# Patient Record
Sex: Female | Born: 1980 | Hispanic: Yes | Marital: Married | Smoking: Never smoker
Health system: Southern US, Community
[De-identification: ages and names within clinical notes are randomized; demographics above are authoritative.]

## PROBLEM LIST (undated history)

## (undated) DIAGNOSIS — M542 Cervicalgia: Secondary | ICD-10-CM

## (undated) DIAGNOSIS — E7800 Pure hypercholesterolemia, unspecified: Secondary | ICD-10-CM

## (undated) DIAGNOSIS — G8929 Other chronic pain: Secondary | ICD-10-CM

## (undated) DIAGNOSIS — I82409 Acute embolism and thrombosis of unspecified deep veins of unspecified lower extremity: Secondary | ICD-10-CM

## (undated) DIAGNOSIS — I2699 Other pulmonary embolism without acute cor pulmonale: Secondary | ICD-10-CM

## (undated) HISTORY — DX: Other pulmonary embolism without acute cor pulmonale: I26.99

## (undated) HISTORY — DX: Acute embolism and thrombosis of unspecified deep veins of unspecified lower extremity: I82.409

---
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
## 2001-03-27 ENCOUNTER — Other Ambulatory Visit: Admission: RE | Admit: 2001-03-27 | Discharge: 2001-03-27 | Payer: Self-pay | Admitting: Internal Medicine

## 2001-11-28 ENCOUNTER — Encounter: Admission: RE | Admit: 2001-11-28 | Discharge: 2002-02-26 | Payer: Self-pay | Admitting: Family Medicine

## 2002-09-03 ENCOUNTER — Other Ambulatory Visit: Admission: RE | Admit: 2002-09-03 | Discharge: 2002-09-03 | Payer: Self-pay | Admitting: Family Medicine

## 2003-07-12 ENCOUNTER — Emergency Department (HOSPITAL_COMMUNITY): Admission: EM | Admit: 2003-07-12 | Discharge: 2003-07-12 | Payer: Self-pay | Admitting: Emergency Medicine

## 2006-06-17 ENCOUNTER — Emergency Department (HOSPITAL_COMMUNITY): Admission: EM | Admit: 2006-06-17 | Discharge: 2006-06-17 | Payer: Self-pay | Admitting: Emergency Medicine

## 2006-12-30 ENCOUNTER — Ambulatory Visit: Payer: Self-pay | Admitting: Internal Medicine

## 2007-01-03 LAB — CONVERTED CEMR LAB
ALT: 25 units/L (ref 0–35)
AST: 20 units/L (ref 0–37)
BUN: 10 mg/dL (ref 6–23)
CO2: 30 meq/L (ref 19–32)
Calcium: 9.5 mg/dL (ref 8.4–10.5)
Creatinine, Ser: 0.7 mg/dL (ref 0.4–1.2)
Direct LDL: 164.6 mg/dL
Eosinophils Absolute: 0.1 10*3/uL (ref 0.0–0.6)
Eosinophils Relative: 1.7 % (ref 0.0–5.0)
Folate: 6.6 ng/mL
GFR calc Af Amer: 129 mL/min
HCT: 36.4 % (ref 36.0–46.0)
HDL: 49.4 mg/dL (ref >39.0)
MCV: 89.1 fL (ref 78.0–100.0)
Neutrophils Relative %: 58.1 % (ref 43.0–77.0)
Platelets: 194 10*3/uL (ref 150–400)
Potassium: 4 meq/L (ref 3.5–5.1)
RBC: 4.09 M/uL (ref 3.87–5.11)
RDW: 12.6 % (ref 11.5–14.6)
Total CHOL/HDL Ratio: 4.6
Triglycerides: 58 mg/dL (ref 0–149)
VLDL: 12 mg/dL (ref 0–40)
WBC: 5 10*3/uL (ref 4.5–10.5)

## 2007-01-16 ENCOUNTER — Encounter: Payer: Self-pay | Admitting: Internal Medicine

## 2007-01-16 ENCOUNTER — Encounter: Admission: RE | Admit: 2007-01-16 | Discharge: 2007-01-16 | Payer: Self-pay | Admitting: Internal Medicine

## 2007-05-04 HISTORY — PX: TUBAL LIGATION: SHX77

## 2009-08-01 HISTORY — PX: HYSTEROSCOPY: SHX211

## 2012-06-17 ENCOUNTER — Encounter (HOSPITAL_COMMUNITY): Payer: Self-pay

## 2012-06-17 DIAGNOSIS — Z862 Personal history of diseases of the blood and blood-forming organs and certain disorders involving the immune mechanism: Secondary | ICD-10-CM | POA: Insufficient documentation

## 2012-06-17 DIAGNOSIS — R079 Chest pain, unspecified: Secondary | ICD-10-CM | POA: Insufficient documentation

## 2012-06-17 DIAGNOSIS — Y929 Unspecified place or not applicable: Secondary | ICD-10-CM | POA: Insufficient documentation

## 2012-06-17 DIAGNOSIS — M79609 Pain in unspecified limb: Secondary | ICD-10-CM | POA: Insufficient documentation

## 2012-06-17 DIAGNOSIS — Z8639 Personal history of other endocrine, nutritional and metabolic disease: Secondary | ICD-10-CM | POA: Insufficient documentation

## 2012-06-17 DIAGNOSIS — T148XXA Other injury of unspecified body region, initial encounter: Secondary | ICD-10-CM | POA: Insufficient documentation

## 2012-06-17 DIAGNOSIS — X58XXXA Exposure to other specified factors, initial encounter: Secondary | ICD-10-CM | POA: Insufficient documentation

## 2012-06-17 DIAGNOSIS — Y939 Activity, unspecified: Secondary | ICD-10-CM | POA: Insufficient documentation

## 2012-06-17 NOTE — ED Notes (Signed)
Pt reports fatigue and tingling in (L) arm starting Wednesday night, tingling in her arm subsided, pt reports tingling returned tonight while washing dishes and chest discomfort when moving from one side to the next. Pt denies SOB, N/V, or diaphoresis. Pt was dx w/fly 2 weeks ago, reports she still has a productive cough w/yellow/green mucus. No neuro deficits, grips and strengths equal bilateral, no slurred speech or arm drift

## 2012-06-18 ENCOUNTER — Emergency Department (HOSPITAL_COMMUNITY)
Admission: EM | Admit: 2012-06-18 | Discharge: 2012-06-18 | Disposition: A | Discharge status: Home / Self Care | Payer: 59 | Attending: Emergency Medicine | Admitting: Emergency Medicine

## 2012-06-18 ENCOUNTER — Encounter (HOSPITAL_COMMUNITY): Payer: Self-pay | Admitting: Emergency Medicine

## 2012-06-18 ENCOUNTER — Emergency Department (HOSPITAL_COMMUNITY): Payer: 59

## 2012-06-18 DIAGNOSIS — T148XXA Other injury of unspecified body region, initial encounter: Secondary | ICD-10-CM

## 2012-06-18 HISTORY — DX: Pure hypercholesterolemia, unspecified: E78.00

## 2012-06-18 LAB — COMPREHENSIVE METABOLIC PANEL
ALT: 12 U/L (ref 0–35)
AST: 17 U/L (ref 0–37)
Albumin: 3.6 g/dL (ref 3.5–5.2)
Calcium: 9.2 mg/dL (ref 8.4–10.5)
GFR calc Af Amer: 89 mL/min — ABNORMAL LOW (ref >90)
Potassium: 3.9 mEq/L (ref 3.5–5.1)
Sodium: 131 mEq/L — ABNORMAL LOW (ref 135–145)
Total Protein: 7 g/dL (ref 6.0–8.3)

## 2012-06-18 LAB — CBC WITH DIFFERENTIAL/PLATELET
Basophils Absolute: 0 10*3/uL (ref 0.0–0.1)
Basophils Relative: 0 % (ref 0–1)
Eosinophils Absolute: 0.1 10*3/uL (ref 0.0–0.7)
Eosinophils Relative: 1 % (ref 0–5)
MCH: 30.9 pg (ref 26.0–34.0)
MCV: 89 fL (ref 78.0–100.0)
Neutrophils Relative %: 54 % (ref 43–77)
Platelets: 186 10*3/uL (ref 150–400)
RDW: 13.1 % (ref 11.5–15.5)

## 2012-06-18 LAB — POCT I-STAT TROPONIN I: Troponin i, poc: 0.01 ng/mL (ref 0.00–0.08)

## 2012-06-18 MED ORDER — NAPROXEN 375 MG PO TABS: 375.0000 mg | ORAL_TABLET | Freq: Two times a day (BID) | ORAL | Status: DC

## 2012-06-18 MED ORDER — METHOCARBAMOL 500 MG PO TABS
1000.0000 mg | ORAL_TABLET | Freq: Once | ORAL | Status: AC
  Administered 2012-06-18: 1000 mg via ORAL
  Filled 2012-06-18: qty 2

## 2012-06-18 MED ORDER — METHOCARBAMOL 500 MG PO TABS: 500.0000 mg | ORAL_TABLET | Freq: Two times a day (BID) | ORAL | Status: DC

## 2012-06-18 NOTE — ED Provider Notes (Signed)
History     CSN: [id number]  Arrival date & time 06/17/12  2335   First MD Initiated Contact with Patient 06/18/12 0034      Chief Complaint  Patient presents with  . Weakness  . Arm Pain  . Chest Pain    (Consider location/radiation/quality/duration/timing/severity/associated sxs/prior treatment) Patient is a 32 y.o. female presenting with arm pain. The history is provided by the patient.  Arm Pain This is a new problem. The current episode started more than 2 days ago. The problem occurs constantly. The problem has not changed since onset.Associated symptoms include chest pain. Pertinent negatives include no shortness of breath. Associated symptoms comments: Upper left back pain. Nothing aggravates the symptoms. Nothing relieves the symptoms. She has tried nothing for the symptoms. The treatment provided no relief.  Pain is in the upper left back.  No pain with inspiration.  No SOB.  No leg swelling or pain.  No long car trips or plane trips  Past Medical History  Diagnosis Date  . High cholesterol     Past Surgical History  Procedure Laterality Date  . Cesarean section    . Tubal ligation      Family History  Problem Relation Age of Onset  . Heart failure Mother   . Heart failure Father     History  Substance Use Topics  . Smoking status: Never Smoker   . Smokeless tobacco: Not on file  . Alcohol Use: No    OB History   Grav Para Term Preterm Abortions TAB SAB Ect Mult Living                  Review of Systems  Respiratory: Negative for shortness of breath.   Cardiovascular: Positive for chest pain. Negative for palpitations and leg swelling.  All other systems reviewed and are negative.    Allergies  Sulfa antibiotics  Home Medications  No current outpatient prescriptions on file.  BP 121/62  Pulse 62  Temp(Src) 98.7 F (37.1 C) (Oral)  Resp 16  SpO2 100%  LMP 05/22/2012  Physical Exam  Constitutional: She is oriented to person, place, and  time. She appears well-developed and well-nourished.  HENT:  Head: Normocephalic and atraumatic.  Mouth/Throat: Oropharynx is clear and moist.  Eyes: Conjunctivae are normal. Pupils are equal, round, and reactive to light.  Neck: Normal range of motion. Neck supple.  No step off crepitance of the c spine  Cardiovascular: Normal rate, regular rhythm and intact distal pulses.   Pulmonary/Chest: Effort normal and breath sounds normal. She has no wheezes. She has no rales.  Abdominal: Soft. Bowel sounds are normal. There is no tenderness. There is no rebound and no guarding.  Neurological: She is alert and oriented to person, place, and time. She has normal reflexes. She displays normal reflexes. Coordination normal.  Sensation intact to LUE  Skin: Skin is warm and dry.  Psychiatric: She has a normal mood and affect.    ED Course  Procedures (including critical care time)  Labs Reviewed  COMPREHENSIVE METABOLIC PANEL - Abnormal; Notable for the following:    Sodium 131 (*)    GFR calc non Af Amer 77 (*)    GFR calc Af Amer 89 (*)    All other components within normal limits  CBC WITH DIFFERENTIAL  POCT I-STAT TROPONIN I   No results found.   No diagnosis found.    MDM   Date: 06/18/2012  Rate: 67  Rhythm: normal sinus rhythm  QRS Axis: normal  Intervals: normal  ST/T Wave abnormalities: normal  Conduction Disutrbances: none  Narrative Interpretation: unremarkable   PERC negative and wells 0  Patient recently started new exercise routine with weights with palpable muscle spasm in upper back and in the setting of > 8 hours of sx ACS is excluded.  Return for worsening symptoms follow up with your family doctor for ongoing care        Cassanda Walmer K Willean Schurman-Rasch, MD 06/18/12 [phone]

## 2012-06-18 NOTE — ED Notes (Signed)
The patient is AOx4 and comfortable with her discharge instructions. 

## 2012-10-27 ENCOUNTER — Encounter: Payer: Self-pay | Admitting: *Deleted

## 2012-11-09 ENCOUNTER — Ambulatory Visit (INDEPENDENT_AMBULATORY_CARE_PROVIDER_SITE_OTHER): Payer: 59 | Admitting: Nurse Practitioner

## 2012-11-09 ENCOUNTER — Encounter: Payer: Self-pay | Admitting: Nurse Practitioner

## 2012-11-09 VITALS — BP 114/50 | HR 64 | Resp 12 | Ht 66.0 in | Wt 183.4 lb

## 2012-11-09 DIAGNOSIS — Z01419 Encounter for gynecological examination (general) (routine) without abnormal findings: Secondary | ICD-10-CM

## 2012-11-09 DIAGNOSIS — Z0000 Encounter for general adult medical examination without abnormal findings: Secondary | ICD-10-CM

## 2012-11-09 LAB — POCT URINALYSIS DIPSTICK
Leukocytes, UA: NEGATIVE
Urobilinogen, UA: NEGATIVE
pH, UA: 5.5

## 2012-11-09 NOTE — Progress Notes (Signed)
32 y.o. [id number] Married African American Fe here for annual exam.  Menses is regular lasting 6-7. some cramps CD 15 -18 with PMS and emotional symptoms with no energy. So decrease in physical exercise during that time.  Patient's last menstrual period was 11/03/2012.          Sexually active: yes  The current method of family planning is tubal ligation.    Exercising: yes  strength training and circuit  Smoker:  no  Health Maintenance: Pap:  11/08/2011 normal with  Negative HR HPV MMG:  never TDaP:  7/3//2012 Labs: Hgb-13.8    reports that she has never smoked. She has never used smokeless tobacco. She reports that she does not drink alcohol or use illicit drugs.  Past Medical History  Diagnosis Date  . High cholesterol     Past Surgical History  Procedure Laterality Date  . Cesarean section    . Tubal ligation    . Vaginal delivery      x3  . Induced abortion    . Polypectomy    . Hysteroscopy      Current Outpatient Prescriptions  Medication Sig Dispense Refill  . b complex vitamins tablet Take 1 tablet by mouth daily.      . magnesium 30 MG tablet Take 30 mg by mouth 2 (two) times daily.       No current facility-administered medications for this visit.    Family History  Problem Relation Age of Onset  . Heart failure Mother   . Heart failure Father   . Diabetes Brother   . Breast cancer Paternal Aunt   . Cancer Paternal Aunt     colon cancer    ROS:  Pertinent items are noted in HPI.  Otherwise, a comprehensive ROS was negative.  Exam:   BP 114/50  Pulse 64  Resp 12  Ht 5\' 6"  (1.676 m)  Wt 183 lb 6.4 oz (83.19 kg)  BMI 29.62 kg/m2  LMP 11/03/2012 Height: 5\' 6"  (167.6 cm) weight 203 at visit 11/2011 Ht Readings from Last 3 Encounters:  11/09/12 5\' 6"  (1.676 m)  12/30/06 5\' 6"  (1.676 m)    General appearance: alert, cooperative and appears stated age Head: Normocephalic, without obvious abnormality, atraumatic Neck: no adenopathy, supple, symmetrical,  trachea midline and thyroid normal to inspection and palpation Lungs: clear to auscultation bilaterally Breasts: normal appearance, no masses or tenderness Heart: regular rate and rhythm Abdomen: soft, non-tender; no masses,  no organomegaly Extremities: extremities normal, atraumatic, no cyanosis or edema Skin: Skin color, texture, turgor normal. No rashes or lesions Lymph nodes: Cervical, supraclavicular, and axillary nodes normal. No abnormal inguinal nodes palpated Neurologic: Grossly normal   Pelvic: External genitalia:  no lesions              Urethra:  normal appearing urethra with no masses, tenderness or lesions              Bartholin's and Skene's: normal                 Vagina: normal appearing vagina with normal color and discharge, no lesions              Cervix: anteverted              Pap taken: no Bimanual Exam:  Uterus:  normal size, contour, position, consistency, mobility, non-tender              Adnexa: no mass, fullness, tenderness  Rectovaginal: Confirms               Anus:  normal sphincter tone, no lesions  A:  Well Woman with normal exam  S/P BTL 2009  History of PMS  P:   Pap smear as per guidelines   Mammogram  Due at age 84  counseled on diet and exercise  Discussion of treatments for PMS - declines SSRI, she may try OTC Vit B return annually or prn  An After Visit Summary was printed and given to the patient.

## 2012-11-09 NOTE — Patient Instructions (Addendum)

## 2012-11-11 NOTE — Progress Notes (Signed)
Encounter reviewed by Dr. Jaquell Seddon Silva.  

## 2013-04-18 ENCOUNTER — Telehealth: Payer: Self-pay | Admitting: Nurse Practitioner

## 2013-04-18 NOTE — Telephone Encounter (Signed)
Patient thinks she may have an ingrown hair wants to see patty.

## 2013-04-18 NOTE — Telephone Encounter (Signed)
Patient describes ingrown hair marble sized on Vulvar area. Denies fever. Requests appointment. OV scheduled with Lauro Franklin, FNP for 12/18 at 0945.  Routing to provider for final review. Patient agreeable to disposition. Will close encounter

## 2013-04-19 ENCOUNTER — Encounter: Payer: Self-pay | Admitting: Nurse Practitioner

## 2013-04-19 ENCOUNTER — Ambulatory Visit (INDEPENDENT_AMBULATORY_CARE_PROVIDER_SITE_OTHER): Payer: 59 | Admitting: Nurse Practitioner

## 2013-04-19 VITALS — BP 116/64 | HR 68 | Ht 66.0 in | Wt 191.0 lb

## 2013-04-19 DIAGNOSIS — L723 Sebaceous cyst: Secondary | ICD-10-CM

## 2013-04-19 MED ORDER — CEPHALEXIN 500 MG PO CAPS: ORAL_CAPSULE | ORAL | Status: DC

## 2013-04-19 NOTE — Patient Instructions (Addendum)
Epidermal Cyst An epidermal cyst is sometimes called a sebaceous cyst, epidermal inclusion cyst, or infundibular cyst. These cysts usually contain a substance that looks "pasty" or "cheesy" and may have a bad smell. This substance is a protein called keratin. Epidermal cysts are usually found on the face, neck, or trunk. They may also occur in the vaginal area or other parts of the genitalia of both men and women. Epidermal cysts are usually small, painless, slow-growing bumps or lumps that move freely under the skin. It is important not to try to pop them. This may cause an infection and lead to tenderness and swelling. CAUSES  Epidermal cysts may be caused by a deep penetrating injury to the skin or a plugged hair follicle, often associated with acne. SYMPTOMS  Epidermal cysts can become inflamed and cause:  Redness.  Tenderness.  Increased temperature of the skin over the bumps or lumps.  Grayish-white, bad smelling material that drains from the bump or lump. DIAGNOSIS  Epidermal cysts are easily diagnosed by your caregiver during an exam. Rarely, a tissue sample (biopsy) may be taken to rule out other conditions that may resemble epidermal cysts. TREATMENT   Epidermal cysts often get better and disappear on their own. They are rarely ever cancerous.  If a cyst becomes infected, it may become inflamed and tender. This may require opening and draining the cyst. Treatment with antibiotics may be necessary. When the infection is gone, the cyst may be removed with minor surgery.  Small, inflamed cysts can often be treated with antibiotics or by injecting steroid medicines.  Sometimes, epidermal cysts become large and bothersome. If this happens, surgical removal in your caregiver's office may be necessary. HOME CARE INSTRUCTIONS  Only take over-the-counter or prescription medicines as directed by your caregiver.  Take your antibiotics as directed. Finish them even if you start to feel  better. SEEK MEDICAL CARE IF:   Your cyst becomes tender, red, or swollen.  Your condition is not improving or is getting worse.  You have any other questions or concerns. MAKE SURE YOU:  Understand these instructions.  Will watch your condition.  Will get help right away if you are not doing well or get worse. Document Released: 03/20/2004 Document Revised: 07/12/2011 Document Reviewed: 10/26/2010 [facility] Patient Information 2014 [place], [place].   If symptoms worsens to call back

## 2013-04-19 NOTE — Progress Notes (Signed)
Subjective:     Patient ID: Olivia Harrison, female   DOB: April 13, 1981, 32 y.o.   MRN: [id number]  HPI This 32 yo MAA Fe G5,P4 Presents with complaints of right vulva irritation.  These symptoms started with her menses and having to ear overnight pads due to heavy cycle.  Then toward the end of cycle felt that her hair got stuck on the adhesive and pulled the hair.  It was more painful yesterday with a small amount of exudate that was blood tinged. Several months ago had a similar episode of sebaceous cyst at the suprapubic line that occurred after shaving.  No changes in partner.  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  No known exposure to MRSA.  Review of Systems  Constitutional: Negative for fever, chills and fatigue.  Respiratory: Negative.   Cardiovascular: Negative.   Gastrointestinal: Negative.  Negative for nausea, vomiting, abdominal pain, diarrhea and abdominal distention.  Genitourinary: Positive for genital sores. Negative for dysuria, urgency, frequency, flank pain, vaginal bleeding, vaginal discharge, vaginal pain, menstrual problem, pelvic pain and dyspareunia.  Skin: Negative.   Neurological: Negative.   Psychiatric/Behavioral: Negative.       Objective:   Physical Exam  Constitutional: She is oriented to person, place, and time. She appears well-developed and well-nourished. No distress.  Abdominal: Soft. She exhibits no distension and no mass. There is no tenderness. There is no rebound and no guarding.  Genitourinary:     Right labia cyst with maybe 1/2 cm induration.  No exudate expressed. It is soft. There is a hair very close to the lesion opening. No lymph node swelling or pain.  Herpes culture was taken given her history but doubtful that this is the cause.  Neurological: She is alert and oriented to person, place, and time.  Skin: Skin is warm and dry.  Psychiatric: She has a normal mood and  affect. Her behavior is normal. Judgment and thought content normal.       Assessment:     Sebaceous cyst right labia R/O HSV    Plan:     Start on Keflex 500 mg BID for a week. She will continue with warm compresses and soaks.  If lesion gets worse or needs I & D to CB.  It should go away with med's.  Will call her with culture results

## 2013-04-19 NOTE — Progress Notes (Signed)
Reviewed personally.  M. Suzanne Bristol Soy, MD.  

## 2013-04-30 ENCOUNTER — Encounter: Payer: Self-pay | Admitting: Nurse Practitioner

## 2013-11-15 ENCOUNTER — Ambulatory Visit: Payer: 59 | Admitting: Nurse Practitioner

## 2013-11-26 ENCOUNTER — Encounter: Payer: Self-pay | Admitting: Nurse Practitioner

## 2013-11-26 ENCOUNTER — Ambulatory Visit (INDEPENDENT_AMBULATORY_CARE_PROVIDER_SITE_OTHER): Payer: 59 | Admitting: Nurse Practitioner

## 2013-11-26 VITALS — BP 104/62 | HR 76 | Ht 66.0 in | Wt 198.0 lb

## 2013-11-26 DIAGNOSIS — Z01419 Encounter for gynecological examination (general) (routine) without abnormal findings: Secondary | ICD-10-CM

## 2013-11-26 NOTE — Progress Notes (Signed)
Patient ID: Olivia Harrison, female   DOB: 11/25/1980, 33 y.o.   MRN: [id number] 33 y.o. [id number] Married African American Fe here for annual exam.  Menses are normal, flow for 5-6 days. Heavy for 1-2 then moderate to light. Some cramps and relief with OTC NSAID'S.  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  Patient's last menstrual period was 10/29/2013.          Sexually active: yes  The current method of family planning is tubal ligation.  Exercising: yes strength training and circuit  Smoker: no   Health Maintenance:  Pap: 11/08/2011 normal with Negative HR HPV  MMG: never  TDaP: 7/3//2012  Labs:  Dr. Ricki Miller    reports that she has never smoked. She has never used smokeless tobacco. She reports that she does not drink alcohol or use illicit drugs.  Past Medical History  Diagnosis Date  . High cholesterol     Past Surgical History  Procedure Laterality Date  . Cesarean section  2009    x1  . Vaginal delivery      x3  . Induced abortion    . Hysteroscopy  08/2009    polyps  . Tubal ligation  2009    Current Outpatient Prescriptions  Medication Sig Dispense Refill  . b complex vitamins tablet Take 1 tablet by mouth daily.      . magnesium 30 MG tablet Take 30 mg by mouth 2 (two) times daily.       No current facility-administered medications for this visit.    Family History  Problem Relation Age of Onset  . Heart failure Mother   . Heart failure Father   . Diabetes Brother   . Cancer Paternal Aunt     colon cancer  . Hyperlipidemia Sister   . Heart failure Sister   . Hyperlipidemia Brother   . Breast cancer Maternal Aunt   . Breast cancer Paternal Aunt     ROS:  Pertinent items are noted in HPI.  Otherwise, a comprehensive ROS was negative.  Exam:   BP 104/62  Pulse 76  Ht 5\' 6"  (1.676 m)  Wt 198 lb (89.812 kg)  BMI 31.97 kg/m2  LMP 10/29/2013 Height: 5\' 6"  (167.6 cm)  Ht Readings from Last 3 Encounters:  11/26/13 5\' 6"  (1.676 m)   04/19/13 5\' 6"  (1.676 m)  11/09/12 5\' 6"  (1.676 m)    General appearance: alert, cooperative and appears stated age Head: Normocephalic, without obvious abnormality, atraumatic Neck: no adenopathy, supple, symmetrical, trachea midline and thyroid normal to inspection and palpation Lungs: clear to auscultation bilaterally Breasts: normal appearance, no masses or tenderness Heart: regular rate and rhythm Abdomen: soft, non-tender; no masses,  no organomegaly Extremities: extremities normal, atraumatic, no cyanosis or edema Skin: Skin color, texture, turgor normal. No rashes or lesions Lymph nodes: Cervical, supraclavicular, and axillary nodes normal. No abnormal inguinal nodes palpated Neurologic: Grossly normal   Pelvic: External genitalia:  no lesions              Urethra:  normal appearing urethra with no masses, tenderness or lesions              Bartholin's and Skene's: normal                 Vagina: normal appearing vagina with normal color and discharge, no lesions              Cervix: anteverted  Pap taken: No. Bimanual Exam:  Uterus:  normal size, contour, position, consistency, mobility, non-tender              Adnexa: no mass, fullness, tenderness               Rectovaginal: Confirms               Anus:  normal sphincter tone, no lesions  A:  Well Woman with normal exam  S/P BTL 2009  S/P H' Scopic D&C with polypectomy 08/2009  P:   Reviewed health and wellness pertinent to exam  Pap smear not taken today  Mammogram is due age 33 but she prefers to get one earlier [facility]  Counseled on breast self exam, adequate intake of calcium and vitamin D, diet and exercise return annually or prn  An After Visit Summary was printed and given to the patient.

## 2013-11-26 NOTE — Patient Instructions (Signed)

## 2013-11-29 NOTE — Progress Notes (Signed)
Encounter reviewed by Dr. Brook Silva.  

## 2014-03-04 ENCOUNTER — Encounter: Payer: Self-pay | Admitting: Nurse Practitioner

## 2014-08-15 ENCOUNTER — Telehealth: Payer: Self-pay | Admitting: Nurse Practitioner

## 2014-08-15 ENCOUNTER — Encounter: Payer: Self-pay | Admitting: Nurse Practitioner

## 2014-08-15 NOTE — Telephone Encounter (Signed)
I was unable to reach patient by phone, mailed letter. Re: upcoming appointment has been canceled and needs to be rescheduled.

## 2014-11-28 ENCOUNTER — Ambulatory Visit: Payer: Self-pay | Admitting: Nurse Practitioner

## 2014-11-29 ENCOUNTER — Ambulatory Visit: Payer: 59 | Admitting: Nurse Practitioner

## 2015-03-06 ENCOUNTER — Telehealth: Payer: Self-pay | Admitting: Nurse Practitioner

## 2015-03-06 DIAGNOSIS — Z0000 Encounter for general adult medical examination without abnormal findings: Secondary | ICD-10-CM

## 2015-03-06 NOTE — Telephone Encounter (Signed)
Spoke with patient. Patient is scheduled for her aex on 03/26/2015. Would like to come in early to have fasting labs performed to discuss with Ria CommentPatricia Grubb, FNP at her aex. Lab appointment scheduled for 11/16 at 8:30 am. Agreeable to date and time. Would like to have lipid panel, Vitamin D, CMP, CBC, HgA1C, and TSH checked. Typically has these performed at Dr.Pang's office, but has not yet found another provider after he moved. Advised we are able to perform these labs with our office. Patient is agreeable. Orders placed.  Routing to provider for final review. Patient agreeable to disposition. Will close encounter.

## 2015-03-06 NOTE — Telephone Encounter (Signed)
Patient  has some questions for the nurse. She wants to come in early to have her blood work done. She is scheduled for 03/26/15 for aex. Will she need a order?

## 2015-03-19 ENCOUNTER — Other Ambulatory Visit (INDEPENDENT_AMBULATORY_CARE_PROVIDER_SITE_OTHER): Payer: 59

## 2015-03-19 DIAGNOSIS — Z0000 Encounter for general adult medical examination without abnormal findings: Secondary | ICD-10-CM

## 2015-03-19 LAB — CBC WITH DIFFERENTIAL/PLATELET
Basophils Absolute: 0 10*3/uL (ref 0.0–0.1)
Basophils Relative: 0 % (ref 0–1)
Eosinophils Absolute: 0.1 10*3/uL (ref 0.0–0.7)
Eosinophils Relative: 3 % (ref 0–5)
HEMATOCRIT: 40.5 % (ref 36.0–46.0)
HEMOGLOBIN: 13.4 g/dL (ref 12.0–15.0)
LYMPHS ABS: 1.4 10*3/uL (ref 0.7–4.0)
LYMPHS PCT: 32 % (ref 12–46)
MCH: 30.5 pg (ref 26.0–34.0)
MCHC: 33.1 g/dL (ref 30.0–36.0)
MCV: 92.3 fL (ref 78.0–100.0)
MONO ABS: 0.4 10*3/uL (ref 0.1–1.0)
MONOS PCT: 10 % (ref 3–12)
MPV: 11.7 fL (ref 8.6–12.4)
Neutro Abs: 2.4 10*3/uL (ref 1.7–7.7)
Neutrophils Relative %: 55 % (ref 43–77)
Platelets: 175 10*3/uL (ref 150–400)
RBC: 4.39 MIL/uL (ref 3.87–5.11)
RDW: 13.7 % (ref 11.5–15.5)
WBC: 4.4 10*3/uL (ref 4.0–10.5)

## 2015-03-19 LAB — HEMOGLOBIN A1C
Hgb A1c MFr Bld: 5.2 % (ref <5.7)
MEAN PLASMA GLUCOSE: 103 mg/dL (ref <117)

## 2015-03-19 LAB — COMPREHENSIVE METABOLIC PANEL
ALBUMIN: 4.1 g/dL (ref 3.6–5.1)
ALK PHOS: 63 U/L (ref 33–115)
ALT: 15 U/L (ref 6–29)
AST: 19 U/L (ref 10–30)
BUN: 13 mg/dL (ref 7–25)
CHLORIDE: 104 mmol/L (ref 98–110)
CO2: 28 mmol/L (ref 20–31)
CREATININE: 0.81 mg/dL (ref 0.50–1.10)
Calcium: 9 mg/dL (ref 8.6–10.2)
Glucose, Bld: 77 mg/dL (ref 65–99)
Potassium: 4.3 mmol/L (ref 3.5–5.3)
SODIUM: 139 mmol/L (ref 135–146)
TOTAL PROTEIN: 6.8 g/dL (ref 6.1–8.1)
Total Bilirubin: 0.7 mg/dL (ref 0.2–1.2)

## 2015-03-19 LAB — TSH: TSH: 1.684 u[IU]/mL (ref 0.350–4.500)

## 2015-03-19 LAB — LIPID PANEL
CHOL/HDL RATIO: 2.9 ratio (ref <=5.0)
CHOLESTEROL: 219 mg/dL — AB (ref 125–200)
HDL: 76 mg/dL (ref >=46)
LDL Cholesterol: 135 mg/dL — ABNORMAL HIGH (ref <130)
Triglycerides: 40 mg/dL (ref <150)
VLDL: 8 mg/dL (ref <30)

## 2015-03-20 LAB — VITAMIN D 25 HYDROXY (VIT D DEFICIENCY, FRACTURES): VIT D 25 HYDROXY: 27 ng/mL — AB (ref 30–100)

## 2015-03-26 ENCOUNTER — Ambulatory Visit: Payer: Self-pay | Admitting: Nurse Practitioner

## 2015-03-26 ENCOUNTER — Encounter: Payer: Self-pay | Admitting: Nurse Practitioner

## 2015-04-15 ENCOUNTER — Ambulatory Visit (INDEPENDENT_AMBULATORY_CARE_PROVIDER_SITE_OTHER): Payer: 59 | Admitting: Nurse Practitioner

## 2015-04-15 ENCOUNTER — Encounter: Payer: Self-pay | Admitting: Nurse Practitioner

## 2015-04-15 VITALS — BP 100/64 | HR 64 | Ht 66.5 in | Wt 168.0 lb

## 2015-04-15 DIAGNOSIS — Z0000 Encounter for general adult medical examination without abnormal findings: Secondary | ICD-10-CM

## 2015-04-15 DIAGNOSIS — Z01419 Encounter for gynecological examination (general) (routine) without abnormal findings: Secondary | ICD-10-CM

## 2015-04-15 DIAGNOSIS — R87619 Unspecified abnormal cytological findings in specimens from cervix uteri: Secondary | ICD-10-CM

## 2015-04-15 MED ORDER — VITAMIN D (ERGOCALCIFEROL) 1.25 MG (50000 UNIT) PO CAPS: 50000.0000 [IU] | ORAL_CAPSULE | ORAL | Status: DC

## 2015-04-15 NOTE — Patient Instructions (Signed)

## 2015-04-15 NOTE — Progress Notes (Signed)
Patient ID: Early CharsRosalind L Harrison, female   DOB: 01-20-1981, 34 y.o.   MRN: [id number]  34 y.o. [id number] Married  African American Fe here for annual exam.  Menses for 7 days. 3 days heavier with use of super pad changing  2-3 hours.  Flow is manageable.  Some cramps.  Patient's last menstrual period was 04/11/2015 (exact date).          Sexually active: Yes.    The current method of family planning is tubal ligation.    Exercising: Yes.    Gym/ health club routine includes high impact aerobics  and and strength training 6 days per week. Smoker:  no  Health Maintenance: Pap: 11/08/2011 normal with Negative HR HPV Mammo:  05/2014 - requested copy TDaP: 7/3//2012  Labs:03/19/15 in EPIC  Urine: large RBC - on menses, PH: 5   reports that she has never smoked. She has never used smokeless tobacco. She reports that she does not drink alcohol or use illicit drugs.  Past Medical History  Diagnosis Date  . High cholesterol     Past Surgical History  Procedure Laterality Date  . Cesarean section  2009    x1  . Vaginal delivery      x3  . Induced abortion    . Hysteroscopy  08/2009    polyps  . Tubal ligation  2009    Current Outpatient Prescriptions  Medication Sig Dispense Refill  . b complex vitamins tablet Take 1 tablet by mouth daily.    . Cholecalciferol (VITAMIN D) 2000 UNITS CAPS Take 1 capsule by mouth daily.    . magnesium 30 MG tablet Take 30 mg by mouth 2 (two) times daily.    . Vitamin D, Ergocalciferol, (DRISDOL) 50000 UNITS CAPS capsule Take 1 capsule (50,000 Units total) by mouth every 7 (seven) days. 30 capsule 3   No current facility-administered medications for this visit.    Family History  Problem Relation Age of Onset  . Heart attack Mother [id number]  . Heart failure Father   . Hypertension Father   . Diabetes Brother   . Colon cancer Paternal Aunt 2252    colon cancer stage IV, died within a few days  . Hyperlipidemia Sister   . Heart failure Sister   .  Hyperlipidemia Brother   . Breast cancer Maternal Aunt 35    mastectomy  . Breast cancer Paternal Aunt 50    died within 1 yr ?  Marland Kitchen. Hypertension Paternal Grandmother     ROS:  Pertinent items are noted in HPI.  Otherwise, a comprehensive ROS was negative.  Exam:   BP 100/64 mmHg  Pulse 64  Ht 5' 6.5" (1.689 m)  Wt 168 lb (76.204 kg)  BMI 26.71 kg/m2  LMP 04/11/2015 (Exact Date) Height: 5' 6.5" (168.9 cm) Ht Readings from Last 3 Encounters:  04/15/15 5' 6.5" (1.689 m)  11/26/13 5\' 6"  (1.676 m)  04/19/13 5\' 6"  (1.676 m)    General appearance: alert, cooperative and appears stated age Head: Normocephalic, without obvious abnormality, atraumatic Neck: no adenopathy, supple, symmetrical, trachea midline and thyroid normal to inspection and palpation Lungs: clear to auscultation bilaterally Breasts: normal appearance, no masses or tenderness Heart: regular rate and rhythm Abdomen: soft, non-tender; no masses,  no organomegaly Extremities: extremities normal, atraumatic, no cyanosis or edema Skin: Skin color, texture, turgor normal. No rashes or lesions Lymph nodes: Cervical, supraclavicular, and axillary nodes normal. No abnormal inguinal nodes palpated Neurologic: Grossly normal   Pelvic: External genitalia:  no lesions              Urethra:  normal appearing urethra with no masses, tenderness or lesions              Bartholin's and Skene's: normal                 Vagina: normal appearing vagina with normal color and discharge, no lesions              Cervix: anteverted              Pap taken: Yes.   Bimanual Exam:  Uterus:  normal size, contour, position, consistency, mobility, non-tender              Adnexa: no mass, fullness, tenderness               Rectovaginal: Confirms               Anus:  normal sphincter tone, no lesions  Chaperone present: yes  A:  Well Woman with normal exam  S/P BTL 2009 S/P H' Scopic D&C with polypectomy 08/2009   P:    Reviewed health and wellness pertinent to exam  Pap smear as above  Mammogram is due at age 57  Counseled on breast self exam, adequate intake of calcium and vitamin D, diet and exercise return annually or prn  An After Visit Summary was printed and given to the patient.

## 2015-04-16 NOTE — Progress Notes (Signed)
Encounter reviewed Sequan Auxier, MD   

## 2015-04-17 LAB — IPS PAP TEST WITH HPV

## 2015-04-21 NOTE — Addendum Note (Signed)
Addended by: Roanna BanningGRUBB, Maricella Filyaw R on: 04/21/2015 08:56 AM   Modules accepted: Orders

## 2015-04-23 LAB — IPS HPV GENOTYPING 16/18

## 2015-04-29 ENCOUNTER — Telehealth: Payer: Self-pay | Admitting: Emergency Medicine

## 2015-04-29 NOTE — Telephone Encounter (Signed)
-----   Message from Ria CommentPatricia Grubb, FNP sent at 04/27/2015 10:01 PM EST ----- Please inform patient of normal pap but + HR HPV.  The #16 & 18 in negative.  But she needs to have a repeat pap next year.  08 recall.

## 2015-04-30 NOTE — Telephone Encounter (Signed)
Patient notified of results and aware of pap smear normal results and positive HPV with negative 16/18. Discussed HPV, prevalence and transmission. Patient advised that most important is follow up in one year for repeat pap smear. Patient verbalized understanding. Patient became tearful after conversation and results discussion. She is offered consult appointment with Ria CommentPatricia Grubb, FNP to discuss results further and she declines at this time. She states she will call back if she decides to do so.  08 Recall entered. Patient has annual exam scheduled for 05/2016.  Routing to provider for final review. Patient agreeable to disposition. Will close encounter.

## 2016-01-30 ENCOUNTER — Ambulatory Visit: Payer: 59

## 2016-03-08 ENCOUNTER — Ambulatory Visit (INDEPENDENT_AMBULATORY_CARE_PROVIDER_SITE_OTHER): Payer: 59 | Admitting: Family Medicine

## 2016-03-08 VITALS — BP 118/76 | HR 79 | Temp 98.1°F | Resp 16 | Ht 66.0 in | Wt 176.0 lb

## 2016-03-08 DIAGNOSIS — S161XXA Strain of muscle, fascia and tendon at neck level, initial encounter: Secondary | ICD-10-CM | POA: Diagnosis not present

## 2016-03-08 DIAGNOSIS — S39012A Strain of muscle, fascia and tendon of lower back, initial encounter: Secondary | ICD-10-CM

## 2016-03-08 MED ORDER — MELOXICAM 15 MG PO TABS: 15.0000 mg | ORAL_TABLET | Freq: Every day | ORAL | 1 refills | Status: DC

## 2016-03-08 MED ORDER — CYCLOBENZAPRINE HCL 10 MG PO TABS: 10.0000 mg | ORAL_TABLET | Freq: Three times a day (TID) | ORAL | 0 refills | Status: DC | PRN

## 2016-03-08 NOTE — Patient Instructions (Signed)
Start Meloxicam 15 mg once daily as needed for inflammation.  Take cyclobenzaprine 10 mg up to 3 times daily for muscle spasms.     IF you received an x-ray today, you will receive an invoice from [facility]. Please contact [facility] at [phone] with questions or concerns regarding your invoice.   IF you received labwork today, you will receive an invoice from [facility]. Please contact [facility] at [phone] with questions or concerns regarding your invoice.   Our billing staff will not be able to assist you with questions regarding bills from these companies.  You will be contacted with the lab results as soon as they are available. The fastest way to get your results is to activate your My Chart account. Instructions are located on the last page of this paperwork. If you have not heard from us regarding the results in 2 weeks, please contact this office.     Back Exercises If you have pain in your back, do these exercises 2-3 times each day or as told by your doctor. When the pain goes away, do the exercises once each day, but repeat the steps more times for each exercise (do more repetitions). If you do not have pain in your back, do these exercises once each day or as told by your doctor. EXERCISES Single Knee to Chest Do these steps 3-5 times in a row for each leg: 1. Lie on your back on a firm bed or the floor with your legs stretched out. 2. Bring one knee to your chest. 3. Hold your knee to your chest by grabbing your knee or thigh. 4. Pull on your knee until you feel a gentle stretch in your lower back. 5. Keep doing the stretch for 10-30 seconds. 6. Slowly let go of your leg and straighten it. Pelvic Tilt Do these steps 5-10 times in a row: 1. Lie on your back on a firm bed or the floor with your legs stretched out. 2. Bend your knees so they point up to the ceiling. Your feet should be flat on the floor. 3. Tighten your  lower belly (abdomen) muscles to press your lower back against the floor. This will make your tailbone point up to the ceiling instead of pointing down to your feet or the floor. 4. Stay in this position for 5-10 seconds while you gently tighten your muscles and breathe evenly. Cat-Cow Do these steps until your lower back bends more easily: 1. Get on your hands and knees on a firm surface. Keep your hands under your shoulders, and keep your knees under your hips. You may put padding under your knees. 2. Let your head hang down, and make your tailbone point down to the floor so your lower back is round like the back of a cat. 3. Stay in this position for 5 seconds. 4. Slowly lift your head and make your tailbone point up to the ceiling so your back hangs low (sags) like the back of a cow. 5. Stay in this position for 5 seconds. Press-Ups Do these steps 5-10 times in a row: 1. Lie on your belly (face-down) on the floor. 2. Place your hands near your head, about shoulder-width apart. 3. While you keep your back relaxed and keep your hips on the floor, slowly straighten your arms to raise the top half of your body and lift your shoulders. Do not use your back muscles. To make yourself more comfortable, you may change where you place your  hands. 4. Stay in this position for 5 seconds. 5. Slowly return to lying flat on the floor. Bridges Do these steps 10 times in a row: 1. Lie on your back on a firm surface. 2. Bend your knees so they point up to the ceiling. Your feet should be flat on the floor. 3. Tighten your butt muscles and lift your butt off of the floor until your waist is almost as high as your knees. If you do not feel the muscles working in your butt and the back of your thighs, slide your feet 1-2 inches farther away from your butt. 4. Stay in this position for 3-5 seconds. 5. Slowly lower your butt to the floor, and let your butt muscles relax. If this exercise is too easy, try doing  it with your arms crossed over your chest. Belly Crunches Do these steps 5-10 times in a row: 1. Lie on your back on a firm bed or the floor with your legs stretched out. 2. Bend your knees so they point up to the ceiling. Your feet should be flat on the floor. 3. Cross your arms over your chest. 4. Tip your chin a little bit toward your chest but do not bend your neck. 5. Tighten your belly muscles and slowly raise your chest just enough to lift your shoulder blades a tiny bit off of the floor. 6. Slowly lower your chest and your head to the floor. Back Lifts Do these steps 5-10 times in a row: 1. Lie on your belly (face-down) with your arms at your sides, and rest your forehead on the floor. 2. Tighten the muscles in your legs and your butt. 3. Slowly lift your chest off of the floor while you keep your hips on the floor. Keep the back of your head in line with the curve in your back. Look at the floor while you do this. 4. Stay in this position for 3-5 seconds. 5. Slowly lower your chest and your face to the floor. GET HELP IF:  Your back pain gets a lot worse when you do an exercise.  Your back pain does not lessen 2 hours after you exercise. If you have any of these problems, stop doing the exercises. Do not do them again unless your doctor says it is okay. GET HELP RIGHT AWAY IF:  You have sudden, very bad back pain. If this happens, stop doing the exercises. Do not do them again unless your doctor says it is okay.   This information is not intended to replace advice given to you by your health care provider. Make sure you discuss any questions you have with your health care provider.   Document Released: 05/22/2010 Document Revised: 01/08/2015 Document Reviewed: 06/13/2014 Elsevier Interactive Patient Education [employer]2016 Elsevier Inc.
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
## 2016-04-29 ENCOUNTER — Other Ambulatory Visit: Payer: Self-pay | Admitting: Nurse Practitioner

## 2016-04-29 NOTE — Telephone Encounter (Signed)
Medication refill request: Vitamin D Last AEX:  04/15/15 PG Next AEX: 05/05/16 PG Last MMG (if hormonal medication request): 05/23/15 BIRADS1, Density B, [facility] Refill authorized: Historically added. Per lab note on 03/21/15, pt is to take 2000 IU OTC daily.   Routing to DL since PG is out today.

## 2016-05-05 ENCOUNTER — Ambulatory Visit: Payer: 59 | Admitting: Nurse Practitioner

## 2016-05-25 ENCOUNTER — Ambulatory Visit (INDEPENDENT_AMBULATORY_CARE_PROVIDER_SITE_OTHER): Payer: 59 | Admitting: Family Medicine

## 2016-05-25 VITALS — BP 102/72 | HR 79 | Temp 98.4°F | Resp 18 | Ht 66.0 in | Wt 179.0 lb

## 2016-05-25 DIAGNOSIS — M545 Low back pain: Secondary | ICD-10-CM | POA: Diagnosis not present

## 2016-05-25 DIAGNOSIS — R419 Unspecified symptoms and signs involving cognitive functions and awareness: Secondary | ICD-10-CM | POA: Diagnosis not present

## 2016-05-25 DIAGNOSIS — M542 Cervicalgia: Secondary | ICD-10-CM | POA: Diagnosis not present
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
## 2016-05-25 NOTE — Patient Instructions (Addendum)
Resume Meloxicam 15 mg, an antiinflammatory , to decrease inflammation of back and neck.  Cyclobenzaprine is the muscle relaxant that causes drowsiness. Take this only in cases of severe pain and while at home to rest.  I am referring you to neurology for a 2nd opinion of your symptoms of left sided head tension, dizziness, and cognitive changes.  I am placing a referral for you to be evaluated by physical therapy.   IF you received an x-ray today, you will receive an invoice from [facility]. Please contact [facility] at [phone] with questions or concerns regarding your invoice.   IF you received labwork today, you will receive an invoice from [facility]. Please contact [facility] at [phone] with questions or concerns regarding your invoice.   Our billing staff will not be able to assist you with questions regarding bills from these companies.  You will be contacted with the lab results as soon as they are available. The fastest way to get your results is to activate your My Chart account. Instructions are located on the last page of this paperwork. If you have not heard from [place] regarding the results in 2 weeks, please contact this office.      Head Injury, Adult There are many types of head injuries. Head injuries can be as minor as a bump, or they can be more severe. More severe head injuries include:  A jarring injury to the brain (concussion).  A bruise of the brain (contusion). This means there is bleeding in the brain that can cause swelling.  A cracked skull (skull fracture).  Bleeding in the brain that collects, clots, and forms a bump (hematoma). After a head injury, you may need to be observed for a while in the emergency department or urgent care. Sometimes admission to the hospital is needed. After a head injury has happened, most problems occur within the first 24 hours, but side effects may occur up to 7-10 days after the injury. It is important  to watch your condition for any changes. What are the causes? There are many possible causes of a head injury. A serious head injury may happen to someone who is in a car accident (motor vehicle collision). Other causes of major head injuries include bicycle or motorcycle accidents, sports injuries, and falls. Risk factors This condition is more likely to occur in people who:  Drink a lot of alcohol or use drugs.  Are over the age of 67.  Are at risk for falls. What are the symptoms? There are many possible symptoms of a head injury. Visible symptoms of a head injury include a bruise, bump, or bleeding at the site of the injury. Other non-visible symptoms include:  Feeling sleepy or not being able to stay awake.  Passing out.  Headache.  Seizures.  Dizziness.  Confusion.  Memory problems.  Nausea or vomiting. Other possible symptoms that may develop after the head injury include:  Poor attention and concentration.  Fatigue or tiring easily.  Irritability.  Being uncomfortable around bright lights or loud noises.  Anxiety or depression.  Disturbed sleep. How is this diagnosed? This condition can usually be diagnosed based on your symptoms, a description of the injury, and a physical exam. You may also have imaging tests done, such as a CT scan or MRI. You will also be closely watched. How is this treated? Treatment for this condition depends on the severity and type of injury you have. The main goal of treatment is to prevent complications  and allow the brain time to heal. For mild head injury, you may be sent home and treatment may include:  Observation. A responsible adult should stay with you for 24 hours after your injury and check on you often.  Physical rest.  Brain rest.  Pain medicines. For severe brain injury, treatment may include:  Close observation. This includes hospitalization with frequent physical exams. You may need to go to a hospital that  specializes in head injury.  Pain medicines.  Breathing support. This may include using a ventilator.  Managing the pressure inside the brain (intracranial pressure, or ICP). This may include:  Monitoring the ICP.  Giving medicines to decrease the ICP.  Positioning you to decrease the ICP.  Medicine to prevent seizures.  Surgery to stop bleeding or to remove blood clots (craniotomy).  Surgery to remove part of the skull (decompressive craniectomy). This allows room for the brain to swell. Follow these instructions at home: Activity  Rest as much as possible and avoid activities that are physically hard or tiring.  Make sure you get enough sleep.  Limit activities that require a lot of thought or attention, such as:  Watching TV.  Playing memory games and puzzles.  Job-related work or homework.  Working on [place] computer, [employer] media, and texting.  Avoid activities that could cause another head injury, such as playing sports, until your health care provider approves. Having another head injury, especially before the first one has healed, can be dangerous.  Ask your health care provider when it is safe for you to return to your regular activities, including work or school. Ask your health care provider for a step-by-step plan for gradually returning to activities.  Ask your health care provider when you can drive, ride a bicycle, or use heavy machinery. Your ability to react may be slower after a brain injury. Never do these activities if you are dizzy. Lifestyle  Do not drink alcohol until your health care provider approves, and avoid drug use. Alcohol and certain drugs may slow your recovery and can put you at risk of further injury.  If it is harder than usual to remember things, write them down.  If you are easily distracted, try to do one thing at a time.  Talk with family members or close friends when making important decisions.  Tell your friends, family, a  trusted colleague, and work Production designer, theatre/television/filmmanager about your injury, symptoms, and restrictions. Have them watch for any new or worsening problems. General instructions  Take over-the-counter and prescription medicines only as told by your health care provider.  Have someone stay with you for 24 hours after your head injury. This person should watch you for any changes in your symptoms and be ready to seek medical help, as needed.  Keep all follow-up visits as told by your health care provider. This is important. Prevention  Work on improving your balance and strength to avoid falls.  Wear a seatbelt when you are in a moving vehicle.  Wear a helmet when riding a bicycle, skiing, or doing any other sport or activity that has a risk of injury.  Drink alcohol only in moderation.  Take safety measures in your home, such as:  Removing clutter and tripping hazards from floors and stairways.  Using grab bars in bathrooms and handrails by stairs.  Placing non-slip mats on floors and in bathtubs.  Improving lighting in dim areas. Get help right away if:  You have:  A severe headache that is not  helped by medicine.  Trouble walking, have weakness in your arms and legs, or lose your balance.  Clear or bloody fluid coming from your nose or ears.  Changes in your vision.  A seizure.  You vomit.  Your symptoms get worse.  Your speech is slurred.  You pass out.  You are sleepier and have trouble staying awake.  Your pupils change size. These symptoms may represent a serious problem that is an emergency. Do not wait to see if the symptoms will go away. Get medical help right away. Call your local emergency services (911 in the [place] Do not drive yourself to the hospital.  This information is not intended to replace advice given to you by your health care provider. Make sure you discuss any questions you have with your health care provider. Document Released: 04/19/2005 Document Revised:  11/14/2015 Document Reviewed: 10/28/2015 Elsevier Interactive Patient Education  2017 [employer].

## 2016-06-11 ENCOUNTER — Encounter: Payer: Self-pay | Admitting: Nurse Practitioner
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
## 2016-06-14 ENCOUNTER — Telehealth: Payer: Self-pay

## 2016-06-14 ENCOUNTER — Encounter: Payer: Self-pay | Admitting: Nurse Practitioner

## 2016-06-14 ENCOUNTER — Ambulatory Visit (INDEPENDENT_AMBULATORY_CARE_PROVIDER_SITE_OTHER): Payer: 59 | Admitting: Nurse Practitioner

## 2016-06-14 VITALS — BP 114/66 | HR 64 | Ht 66.0 in | Wt 183.0 lb

## 2016-06-14 DIAGNOSIS — Z0000 Encounter for general adult medical examination without abnormal findings: Secondary | ICD-10-CM

## 2016-06-14 DIAGNOSIS — Z124 Encounter for screening for malignant neoplasm of cervix: Secondary | ICD-10-CM | POA: Diagnosis not present

## 2016-06-14 DIAGNOSIS — Z01419 Encounter for gynecological examination (general) (routine) without abnormal findings: Secondary | ICD-10-CM

## 2016-06-14 DIAGNOSIS — R87619 Unspecified abnormal cytological findings in specimens from cervix uteri: Secondary | ICD-10-CM

## 2016-06-14 LAB — COMPREHENSIVE METABOLIC PANEL
ALBUMIN: 3.9 g/dL (ref 3.6–5.1)
ALT: 15 U/L (ref 6–29)
AST: 19 U/L (ref 10–30)
Alkaline Phosphatase: 51 U/L (ref 33–115)
BILIRUBIN TOTAL: 0.4 mg/dL (ref 0.2–1.2)
BUN: 15 mg/dL (ref 7–25)
CO2: 25 mmol/L (ref 20–31)
CREATININE: 0.71 mg/dL (ref 0.50–1.10)
Calcium: 9.1 mg/dL (ref 8.6–10.2)
Chloride: 104 mmol/L (ref 98–110)
Glucose, Bld: 79 mg/dL (ref 65–99)
Potassium: 4.2 mmol/L (ref 3.5–5.3)
SODIUM: 137 mmol/L (ref 135–146)
TOTAL PROTEIN: 6.8 g/dL (ref 6.1–8.1)

## 2016-06-14 LAB — LIPID PANEL
CHOL/HDL RATIO: 3 ratio (ref <5.0)
CHOLESTEROL: 245 mg/dL — AB (ref <200)
HDL: 81 mg/dL (ref >50)
LDL Cholesterol: 142 mg/dL — ABNORMAL HIGH (ref <100)
Triglycerides: 112 mg/dL (ref <150)
VLDL: 22 mg/dL (ref <30)

## 2016-06-14 LAB — HEMOGLOBIN A1C
HEMOGLOBIN A1C: 5.1 % (ref <5.7)
MEAN PLASMA GLUCOSE: 100 mg/dL

## 2016-06-14 LAB — TSH: TSH: 1.69 m[IU]/L

## 2016-06-14 MED ORDER — VITAMIN D (ERGOCALCIFEROL) 1.25 MG (50000 UNIT) PO CAPS: ORAL_CAPSULE | ORAL | 3 refills | Status: DC

## 2016-06-14 NOTE — Patient Instructions (Signed)

## 2016-06-14 NOTE — Telephone Encounter (Signed)
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

## 2016-06-14 NOTE — Telephone Encounter (Signed)
Can we try another place earlier?
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
## 2016-06-15 LAB — CBC
HEMATOCRIT: 40.1 % (ref 35.0–45.0)
Hemoglobin: 13 g/dL (ref 11.7–15.5)
MCH: 30.7 pg (ref 27.0–33.0)
MCHC: 32.4 g/dL (ref 32.0–36.0)
MCV: 94.6 fL (ref 80.0–100.0)
MPV: 10.9 fL (ref 7.5–12.5)
PLATELETS: 189 10*3/uL (ref 140–400)
RBC: 4.24 MIL/uL (ref 3.80–5.10)
RDW: 13.5 % (ref 11.0–15.0)
WBC: 4.8 10*3/uL (ref 3.8–10.8)

## 2016-06-15 LAB — VITAMIN D 25 HYDROXY (VIT D DEFICIENCY, FRACTURES): VIT D 25 HYDROXY: 19 ng/mL — AB (ref 30–100)

## 2016-06-15 NOTE — Telephone Encounter (Signed)
This should be fine 

## 2016-06-15 NOTE — Progress Notes (Signed)
Encounter reviewed by Dr. Brook Amundson C. Silva.  

## 2016-06-15 NOTE — Telephone Encounter (Signed)
I am sending to [facility]. Both local offices have a wait time for apt.

## 2016-06-17 ENCOUNTER — Encounter: Payer: Self-pay | Admitting: Physical Therapy

## 2016-06-17 ENCOUNTER — Ambulatory Visit: Payer: 59 | Attending: Family Medicine | Admitting: Physical Therapy

## 2016-06-17 DIAGNOSIS — M546 Pain in thoracic spine: Secondary | ICD-10-CM | POA: Diagnosis not present

## 2016-06-17 DIAGNOSIS — M542 Cervicalgia: Secondary | ICD-10-CM

## 2016-06-17 LAB — IPS PAP TEST WITH HPV
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
## 2016-06-21 ENCOUNTER — Ambulatory Visit: Payer: 59 | Admitting: Physical Therapy

## 2016-06-21 DIAGNOSIS — M542 Cervicalgia: Secondary | ICD-10-CM | POA: Diagnosis not present

## 2016-06-21 DIAGNOSIS — M546 Pain in thoracic spine: Secondary | ICD-10-CM

## 2016-06-21 NOTE — Therapy (Signed)
[facility] [address] [place], [place], [address] Phone: [phone]   Fax:  [phone]  Physical Therapy Treatment  Patient Details  Name: GLADA WICKSTROM MRN: [id number] Date of Birth: 02/09/1981 Referring Provider: Doyle Askew, FNP  Encounter Date: 06/21/2016      PT End of Session - 06/21/16 0903    Visit Number 2   Number of Visits 13   Date for PT Re-Evaluation 07/30/16   PT Start Time 0741   PT Stop Time 0820   PT Time Calculation (min) 39 min   Activity Tolerance Patient tolerated treatment well   Behavior During Therapy [facility] for tasks assessed/performed      Past Medical History:  Diagnosis Date  . High cholesterol     Past Surgical History:  Procedure Laterality Date  . CESAREAN SECTION  2009   x1  . HYSTEROSCOPY  08/2009   polyps  . INDUCED ABORTION    . TUBAL LIGATION  2009  . VAGINAL DELIVERY     x3    There were no vitals filed for this visit.      Subjective Assessment - 06/21/16 0743    Subjective She was sore 2 days after the last visit. Late due to new phone alarm not going off.     Currently in Pain? Yes   Pain Score 5    Pain Location Neck   Pain Orientation Right;Left;Posterior   Pain Descriptors / Indicators Tightness;Headache;Aching  stiff  feels like I woke up sleeping wrong.    Pain Type Chronic pain   Aggravating Factors  New exercises?  turning head                         [facility] Adult PT Treatment/Exercise - 06/21/16 0001      Moist Heat Therapy   Number Minutes Moist Heat 15 Minutes   Moist Heat Location Cervical     Manual Therapy   Manual therapy comments upper traps, jaw sub occipital release.   tissue stiff, hard to get her to relax,  manual stretch chest with the assist of gravity pareeinf shoulders into mat   Myofascial Release suboccipital release, trigger point release R upper trap                  PT Short Term  Goals - 06/17/16 1733      PT SHORT TERM GOAL #1   Title Pt will demo at least 10 deg ROM in sidebend and rotation planes by 3/9   Baseline see flowsheet   Time 3   Period Weeks   Status New     PT SHORT TERM GOAL #2   Title Pt will verbalize average resting pain <=4/10 with daily activities   Baseline 5/10 at rest at eval   Time 3   Period Weeks   Status New           PT Long Term Goals - 06/17/16 1734      PT LONG TERM GOAL #1   Title Pt will be able to perform all child care activities pain <=2/10 by 3/30   Baseline severe pain at eval   Time 6   Period Weeks   Status New     PT LONG TERM GOAL #2   Title Pt will be able to perform cerivcal rotation without feeling limited by tightness/pain   Baseline limited at eval   Time 6   Period Weeks  Status New     PT LONG TERM GOAL #3   Title 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, pushing and other exercise 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  it was hard to get her to relax.  She was sore 2 days after last visit.  both upper traps. No new objective findings yet.  Pain back to baseline.   PT Next Visit Plan manual release trigger points, periscapular activation, thoracic mobillity   PT Home Exercise Plan scapular retraction, cervical flexion with rotation   Consulted and Agree with Plan of Care Patient      Patient will benefit from skilled  therapeutic intervention in order to improve the following deficits and impairments:  Decreased range of motion, Increased fascial restricitons, Increased muscle spasms, Decreased activity tolerance, Pain, Improper body mechanics, Decreased strength, Postural dysfunction  Visit Diagnosis: Cervicalgia  Pain in thoracic spine     Problem List There are no active problems to display for this patient.   HARRIS,KAREN PTA 06/21/2016, 9:25 AM  [facility] [address] [place], [place], [address] Phone: [phone]   Fax:  [phone]  Name: Early CharsRosalind L Adinolfi MRN: [id number] Date of Birth: 07/10/1980

## 2016-06-23 ENCOUNTER — Ambulatory Visit: Payer: 59 | Admitting: Physical Therapy

## 2016-06-23 DIAGNOSIS — M542 Cervicalgia: Secondary | ICD-10-CM | POA: Diagnosis not present

## 2016-06-23 DIAGNOSIS — M546 Pain in thoracic spine: Secondary | ICD-10-CM

## 2016-06-23 NOTE — Therapy (Signed)
[place], [place], [address] Phone: [phone]   Fax:  [phone]  Physical Therapy Treatment  Patient Details  Name: Olivia Harrison MRN: [id number] Date of Birth: [birth year] Referring Provider: Sedalia Muta, FNP  Encounter Date: 06/23/2016      PT End of Session - 06/23/16 0807    Visit Number 3   Number of Visits 13   Date for PT Re-Evaluation 07/30/16   PT Start Time 0739   PT Stop Time 0816   PT Time Calculation (min) 37 min   Activity Tolerance Patient tolerated treatment well   Behavior During Therapy [facility] for tasks assessed/performed      Past Medical History:  Diagnosis Date  . High cholesterol     Past Surgical History:  Procedure Laterality Date  . CESAREAN SECTION  2009   x1  . HYSTEROSCOPY  08/2009   polyps  . INDUCED ABORTION    . TUBAL LIGATION  2009  . VAGINAL DELIVERY     x3    There were no vitals filed for this visit.      Subjective Assessment - 06/23/16 0738    Subjective Less pain. None now.   Currently in Pain? No/denies   Pain Score 7   yesterday   Pain Orientation Right;Left;Posterior   Pain Descriptors / Indicators Tightness   Pain Type Chronic pain   Aggravating Factors  cardio class teaching and participation,  tilling   Pain Score 7  Yesterday   Pain Location Back   Pain Orientation Mid;Upper   Pain Descriptors / Indicators Aching            [facility] PT Assessment - 06/23/16 0001      AROM   Cervical - Right Side Bend 48   Cervical - Left Side Bend 50   Cervical - Right Rotation 50   Cervical - Left Rotation 45                     [facility] Adult PT Treatment/Exercise - 06/23/16 0001      Neck Exercises: Supine   Other Supine Exercise Supine scapular stabilization  all issued for home,  narrow grip,  horizontal abduction, ER and sash      Moist Heat Therapy   Number Minutes Moist Heat 15 Minutes   Moist Heat  Location Cervical  UPPER BACK                PT Education - 06/23/16 0807    Education provided Yes   Education Details HEP   Person(s) Educated Patient   Methods Explanation;Demonstration;Tactile cues;Verbal cues;Handout   Comprehension Verbalized understanding;Returned demonstration          PT Short Term Goals - 06/23/16 0815      PT SHORT TERM GOAL #1   Title Pt will demo at least 10 deg ROM in sidebend and rotation planes by 3/9   Baseline SIDEBEND IMPROVED AT LEAST 10 DEGREES   Time 3   Period Weeks   Status Partially Met     PT SHORT TERM GOAL #2   Title Pt will verbalize average resting pain <=4/10 with daily activities   Baseline varies,  0 now   Time 3   Period Weeks   Status On-going           PT Long Term Goals - 06/17/16 1734      PT LONG TERM GOAL #1   Title Pt will  be able to perform all child care activities pain <=2/10 by 3/30   Baseline severe pain at eval   Time 6   Period Weeks   Status New     PT LONG TERM GOAL #2   Title Pt will be able to perform cerivcal rotation without feeling limited by tightness/pain   Baseline limited at eval   Time 6   Period Weeks   Status New     PT LONG TERM GOAL #3   Title 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, 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  Pain decreasing.     PT  Treatment/Interventions ADLs/Self Care Home Management;Cryotherapy;Electrical Stimulation;Iontophoresis 18m/ml Dexamethasone;Functional mobility training;Ultrasound;Traction;Moist Heat;Therapeutic activities;Therapeutic exercise;Neuromuscular re-education;Patient/family education;Passive range of motion;Manual techniques;Dry needling;Taping   PT Next Visit Plan review scap stab.  progress ahep  CHECK GOALS   PT Home Exercise Plan scapular retraction, cervical flexion with rotation,  sUPINE SCAPULAR STABILIZATION   Consulted and Agree with Plan of Care Patient      Patient will benefit from skilled therapeutic intervention in order to improve the following deficits and impairments:  Decreased range of motion, Increased fascial restricitons, Increased muscle spasms, Decreased activity tolerance, Pain, Improper body mechanics, Decreased strength, Postural dysfunction  Visit Diagnosis: Cervicalgia  Pain in thoracic spine     Problem List There are no active problems to display for this patient.   , PTA 06/23/2016, 8:22 AM  [facility] [address]Phone: [phone]  Fax:  [phone] Name: Olivia HARPOLEMRN: [id number] of Birth: 911-24-1982

## 2016-06-23 NOTE — Patient Instructions (Signed)
Over Head Pull: Narrow Grip       On back, knees bent, feet flat, band across thighs, elbows straight but relaxed. Pull hands apart (start). Keeping elbows straight, bring arms up and over head, hands toward floor. Keep pull steady on band. Hold momentarily. Return slowly, keeping pull steady, back to start. Repeat _10 -30__ times. Band color __Black____   Side Pull: Double Arm   On back, knees bent, feet flat. Arms perpendicular to body, shoulder level, elbows straight but relaxed. Pull arms out to sides, elbows straight. Resistance band comes across collarbones, hands toward floor. Hold momentarily. Slowly return to starting position. Repeat _10 - 30__ times. Band color _Black____   Sash   On back, knees bent, feet flat, left hand on left hip, right hand above left. Pull right arm DIAGONALLY (hip to shoulder) across chest. Bring right arm along head toward floor. Hold momentarily. Slowly return to starting position. Repeat 10 -30___ times. Do with left arm. Band color _Black_____   Shoulder Rotation: Double Arm   On back, knees bent, feet flat, elbows tucked at sides, bent 90, hands palms up. Pull hands apart and down toward floor, keeping elbows near sides. Hold momentarily. Slowly return to starting position. Repeat __10 - 30_ times. Band color _Black_____   

## 2016-07-06 ENCOUNTER — Ambulatory Visit: Payer: 59 | Attending: Family Medicine | Admitting: Physical Therapy

## 2016-07-06 DIAGNOSIS — M546 Pain in thoracic spine: Secondary | ICD-10-CM | POA: Insufficient documentation

## 2016-07-06 DIAGNOSIS — M542 Cervicalgia: Secondary | ICD-10-CM | POA: Insufficient documentation

## 2016-07-06 NOTE — Therapy (Signed)
[facility] [address] [place], [place], [address] Phone: [phone]   Fax:  [phone]  Physical Therapy Treatment  Patient Details  Name: Olivia Harrison MRN: [id number] Date of Birth: November 21, 1980 Referring Provider: Doyle AskewKimberly Stephenia Harris, FNP  Encounter Date: 07/06/2016      PT End of Session - 07/06/16 0904    Visit Number 4   Number of Visits 13   Date for PT Re-Evaluation 07/30/16   PT Start Time 0734   PT Stop Time 0816   PT Time Calculation (min) 42 min   Activity Tolerance Patient tolerated treatment well   [facility] for tasks assessed/performed      Past Medical History:  Diagnosis Date  . High cholesterol     Past Surgical History:  Procedure Laterality Date  . CESAREAN SECTION  2009   x1  . HYSTEROSCOPY  08/2009   polyps  . INDUCED ABORTION    . TUBAL LIGATION  2009  . VAGINAL DELIVERY     x3    There were no vitals filed for this visit.      Subjective Assessment - 07/06/16 0739    Subjective No pain now .  Pain is worse at rest.  5-8/10.  After dancing.  My neck motion is fine.  I feel my neck but it is not pain.     Currently in Pain? 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  several tight areas noted                   PT Short Term Goals - 07/06/16 0908      PT SHORT TERM GOAL #1   Title Pt will demo at least 10 deg ROM in sidebend and rotation planes by 3/9   Time 3   Period Weeks     PT SHORT TERM GOAL #2   Title Pt will verbalize average resting pain <=4/10 with daily activities   Baseline up to 8/10   Time 3   Period Weeks   Status On-going           PT Long Term Goals - 06/17/16 1734      PT LONG TERM GOAL #1   Title Pt will be able to perform all  child care activities pain <=2/10 by 3/30   Baseline severe pain at eval   Time 6   Period Weeks   Status New     PT LONG TERM GOAL #2   Title Pt will be able to perform cerivcal rotation without feeling limited by tightness/pain   Baseline limited at eval   Time 6   Period Weeks   Status New     PT LONG TERM GOAL #3   Title 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, pushing and 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  Shoulder flexion WNL.  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  She is not doing her exercises.     PT Next Visit Plan update HEP if she starts doing her HEP.  Measure ROM     PT Home Exercise Plan scapular retraction, cervical flexion with rotation,  sUPINE SCAPULAR STABILIZATION      Patient will benefit from skilled therapeutic intervention in order to improve the following deficits and impairments:  Decreased range of motion, Increased fascial restricitons, Increased muscle spasms, Decreased activity tolerance, Pain, Improper body mechanics, Decreased strength, Postural dysfunction  Visit Diagnosis: Cervicalgia  Pain in thoracic spine     Problem List There are no active problems to display for this patient.   HARRIS,KAREN PTA 07/06/2016, 9:09 AM  [facility] [address] [place], [place], [address] Phone: [phone]   Fax:  [phone]  Name: Olivia Harrison MRN: [id number] Date of Birth: [birth year]

## 2016-07-08 ENCOUNTER — Ambulatory Visit: Payer: 59 | Admitting: Physical Therapy

## 2016-07-08 DIAGNOSIS — M542 Cervicalgia: Secondary | ICD-10-CM

## 2016-07-08 DIAGNOSIS — M546 Pain in thoracic spine: Secondary | ICD-10-CM

## 2016-07-08 NOTE — Therapy (Signed)
[place], [place], [address] Phone: [phone]   Fax:  [phone]  Physical Therapy Treatment  Patient Details  Name: Olivia Harrison MRN: [id number] Date of Birth: 10-22-1980 Referring Provider: Sedalia Muta, FNP  Encounter Date: 07/08/2016      PT End of Session - 07/08/16 1133    Visit Number 5   Number of Visits 13   Date for PT Re-Evaluation 07/30/16   PT Start Time 0736   PT Stop Time 0817   PT Time Calculation (min) 41 min   Activity Tolerance Patient tolerated treatment well   Behavior During Therapy [facility] for tasks assessed/performed      Past Medical History:  Diagnosis Date  . High cholesterol     Past Surgical History:  Procedure Laterality Date  . CESAREAN SECTION  2009   x1  . HYSTEROSCOPY  08/2009   polyps  . INDUCED ABORTION    . TUBAL LIGATION  2009  . VAGINAL DELIVERY     x3    There were no vitals filed for this visit.      Subjective Assessment - 07/08/16 0743    Subjective Pain continues 7/10 after riding in cat 1 hour and 15 minutes.  5/10 now. Exercises: she did 2 X since last visit.   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 Yes   Pain Score 5    Pain Location Neck   Pain Score 0  7/10 since her last visit.   Pain Location Back   Pain Orientation Mid;Upper                         [facility] Adult PT Treatment/Exercise - 07/08/16 0001      Neck Exercises: Machines for Strengthening   UBE (Upper Arm Bike) 5 minutes total , both ways. monitored for pain     Shoulder Exercises: Standing   Protraction 10 reps   Theraband Level (Shoulder Protraction) Level 2 (Red)   Protraction Limitations HEP,  both    External Rotation 10 reps   Theraband Level (Shoulder External Rotation) Level 2 (Red)   External Rotation Limitations roll used,  HEP  Both   Internal Rotation 10 reps   Theraband Level  (Shoulder Internal Rotation) Level 2 (Red)   Internal Rotation Limitations roll used HEP,  both   Extension 10 reps   Theraband Level (Shoulder Extension) Level 2 (Red)   Extension Limitations HEP,  both (ROW)      Moist Heat Therapy   Number Minutes Moist Heat 15 Minutes   Moist Heat Location Cervical  and upper back                PT Education - 07/08/16 1133    Education provided Yes   Education Details HEP   Person(s) Educated Patient   Methods Explanation;Demonstration;Tactile cues;Verbal cues;Handout   Comprehension Verbalized understanding;Returned demonstration          PT Short Term Goals - 07/06/16 0908      PT SHORT TERM GOAL #1   Title Pt will demo at least 10 deg ROM in sidebend and rotation planes by 3/9   Time 3   Period Weeks     PT SHORT TERM GOAL #2   Title Pt will verbalize average resting pain <=4/10 with daily activities   Baseline up to 8/10   Time 3   Period Weeks  Status On-going           PT Long Term Goals - 06/17/16 1734      PT LONG TERM GOAL #1   Title Pt will be able to perform all child care activities pain <=2/10 by 3/30   Baseline severe pain at eval   Time 6   Period Weeks   Status New     PT LONG TERM GOAL #2   Title Pt will be able to perform cerivcal rotation without feeling limited by tightness/pain   Baseline limited at eval   Time 6   Period Weeks   Status New     PT LONG TERM GOAL #3   Title 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, 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   Baseline severe pain, esp with core work at eval   Time 6   Period Weeks   Status New     PT LONG TERM GOAL #4   Title FOTO to 68% ability to indicate significant improvement in functional ability   Baseline 49% at eval   Time 6   Period Weeks   Status New     PT LONG TERM GOAL #5   Title Pt will be able to sit still for at least 20 min without limitation by pain to complete daily activities    Baseline 5 min at eval   Time 6   Period Weeks   Status New               Plan - 07/08/16 1133    Clinical Impression Statement Patient tearful due to having trouble finding a word (she is seeing a MD for this)  Progress with cervical ROM, see flow sheet.  Pain varies form mild to severe.  No nre goals met.     PT Next Visit Plan review [place]wood.  Give/ review ADL info if not already issued   PT Home Exercise Plan scapular retraction, cervical flexion with rotation,  sUPINE SCAPULAR STABILIZATION,  [place]   Consulted and Agree with Plan of Care Patient      Patient will benefit from skilled therapeutic intervention in order to improve the following deficits and impairments:  Decreased range of motion, Increased fascial restricitons, Increased muscle spasms, Decreased activity tolerance, Pain, Improper body mechanics, Decreased strength, Postural dysfunction  Visit Diagnosis: Cervicalgia  Pain in thoracic spine     Problem List There are no active problems to display for this patient.   Donny Heffern PTA 07/08/2016, 11:43 AM  [facility] [address] [place], [place], [address] Phone: [phone]   Fax:  [phone]  Name: Olivia Harrison MRN: [id number] Date of Birth: 1981-03-23

## 2016-07-12 ENCOUNTER — Encounter: Payer: Self-pay | Admitting: Physical Therapy

## 2016-07-12 ENCOUNTER — Ambulatory Visit: Payer: 59 | Admitting: Physical Therapy

## 2016-07-12 DIAGNOSIS — M546 Pain in thoracic spine: Secondary | ICD-10-CM

## 2016-07-12 DIAGNOSIS — M542 Cervicalgia: Secondary | ICD-10-CM | POA: Diagnosis not present
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
## 2016-07-13 ENCOUNTER — Ambulatory Visit: Payer: 59 | Admitting: Physical Therapy

## 2016-07-15 ENCOUNTER — Ambulatory Visit: Payer: 59 | Admitting: Physical Therapy

## 2016-07-15 ENCOUNTER — Encounter: Payer: Self-pay | Admitting: Physical Therapy

## 2016-07-15 DIAGNOSIS — M542 Cervicalgia: Secondary | ICD-10-CM

## 2016-07-15 DIAGNOSIS — M546 Pain in thoracic spine: Secondary | ICD-10-CM

## 2016-07-15 NOTE — Therapy (Signed)
[place], [place], [address] Phone: [phone]   Fax:  [phone]  Physical Therapy Treatment  Patient Details  Name: Olivia Harrison MRN: [id number] Date of Birth: 01-May-1981 Referring Provider: Sedalia Muta, FNP  Encounter Date: 07/15/2016      PT End of Session - 07/15/16 1711    Visit Number 7   Number of Visits 13   Date for PT Re-Evaluation 07/30/16   PT Start Time 0734   PT Stop Time 0820   PT Time Calculation (min) 46 min   Activity Tolerance Patient tolerated treatment well   Behavior During Therapy [facility] for tasks assessed/performed      Past Medical History:  Diagnosis Date  . High cholesterol     Past Surgical History:  Procedure Laterality Date  . CESAREAN SECTION  2009   x1  . HYSTEROSCOPY  08/2009   polyps  . INDUCED ABORTION    . TUBAL LIGATION  2009  . VAGINAL DELIVERY     x3    There were no vitals filed for this visit.      Subjective Assessment - 07/15/16 1707    Subjective I am feeling the same neck pain.  Pain is worse when I am sitting longer (15-30 minutes)  the pelvic tilt has been helping.                         [place] Adult PT Treatment/Exercise - 07/15/16 0001      Neck Exercises: Theraband   Other Theraband Exercises abdominal bracing with breathing,  clams marching.  marching is difficult.     Other Theraband Exercises pelvic clock10 x each 12-6 and 3-9,  2 x clockwise.        Traction   Type of Traction Lumbar   Min (lbs) 15   Max (lbs) 78   Hold Time 60   Rest Time 10   Time 12                  PT Short Term Goals - 07/12/16 1018      PT SHORT TERM GOAL #1   Title Pt will demo at least 10 deg ROM in sidebend and rotation planes by 3/9   Baseline SIDEBEND IMPROVED AT LEAST 10 DEGREES   Status Achieved     PT SHORT TERM GOAL #2   Title Pt will verbalize average resting pain <=4/10 with daily  activities   Baseline depending on activity, standing lower pain levels than seated   Status Partially Met           PT Long Term Goals - 06/17/16 1734      PT LONG TERM GOAL #1   Title Pt will be able to perform all child care activities pain <=2/10 by 3/30   Baseline severe pain at eval   Time 6   Period Weeks   Status New     PT LONG TERM GOAL #2   Title Pt will be able to perform cerivcal rotation without feeling limited by tightness/pain   Baseline limited at eval   Time 6   Period Weeks   Status New     PT LONG TERM GOAL #3   Title 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, 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  Traction continues to be helpful.  Increased abdominal activation noted with exercises.   PT Next Visit Plan core strengthening, re-eval   PT Home Exercise Plan scapular retraction, cervical flexion with rotation,  sUPINE SCAPULAR STABILIZATION,  [place], supine pelvic tilt + leg ext   Consulted and Agree with Plan of Care Patient      Patient will benefit from skilled therapeutic intervention in order to improve the following deficits and impairments:  Decreased range of motion, Increased fascial restricitons, Increased muscle spasms, Decreased activity tolerance, Pain, Improper body mechanics, Decreased strength, Postural  dysfunction  Visit Diagnosis: Cervicalgia  Pain in thoracic spine     Problem List There are no active problems to display for this patient.   Warner Laduca PTA 07/15/2016, 5:14 PM  [facility] [address] [place], [place], [address] Phone: [phone]   Fax:  [phone]  Name: Olivia Harrison MRN: [id number] Date of Birth: [birth year]

## 2016-07-19 ENCOUNTER — Ambulatory Visit: Payer: 59 | Admitting: Physical Therapy

## 2016-07-19 ENCOUNTER — Encounter: Payer: Self-pay | Admitting: Physical Therapy

## 2016-07-19 DIAGNOSIS — M542 Cervicalgia: Secondary | ICD-10-CM | POA: Diagnosis not present

## 2016-07-19 DIAGNOSIS — M546 Pain in thoracic spine: Secondary | ICD-10-CM

## 2016-07-19 NOTE — Therapy (Signed)
[facility] [address] [place], [place], [address] Phone: [phone]   Fax:  [phone]  Physical Therapy Treatment  Patient Details  Name: Olivia Harrison MRN: [id number] Date of Birth: 06/26/1980 Referring Provider: Kimberly Stephenia Harris, FNP  Encounter Date: 07/19/2016      PT End of Session - 07/19/16 1346    Visit Number 8   Number of Visits 13   Date for PT Re-Evaluation 07/30/16   PT Start Time 1148   PT Stop Time 1243   PT Time Calculation (min) 55 min   Activity Tolerance Patient tolerated treatment well   Behavior During Therapy WFL for tasks assessed/performed      Past Medical History:  Diagnosis Date  . High cholesterol     Past Surgical History:  Procedure Laterality Date  . CESAREAN SECTION  2009   x1  . HYSTEROSCOPY  08/2009   polyps  . INDUCED ABORTION    . TUBAL LIGATION  2009  . VAGINAL DELIVERY     x3    There were no vitals filed for this visit.      Subjective Assessment - 07/19/16 1149    Subjective Pain level is less when she engages neutral spine.   Back spasms continue.  Sometimes 2 x a day and some days no spasms.  (Right low bacK)  No lingering pain after it happens.  has a cold   Currently in Pain? Yes   Pain Score 5    Pain Location Neck   Pain Orientation Right;Left;Posterior   Pain Descriptors / Indicators Spasm  I feek like I am wearing a head band around my head.     Pain Type Chronic pain   Aggravating Factors  lifting,  end range turning of head,  hanging shower cutrain,  hanging clothes up   Pain Relieving Factors supine,  holding abdominals tight   Pain Location Back   Pain Descriptors / Indicators --  uncomfortable,  twinges.    Pain Frequency Several days a week   Aggravating Factors  hanging shower cutrains,  lifting tubs and dumping them   Pain Relieving Factors pelvic tilt. sitting down                         [facility] Adult PT  Treatment/Exercise - 07/19/16 0001      Self-Care   Self-Care --  where to get bolsters,  where to get massage,ADL info     Moist Heat Therapy   Number Minutes Moist Heat 15 Minutes   Moist Heat Location Cervical;Lumbar Spine     Manual Therapy   Manual Therapy Soft tissue mobilization   Manual therapy comments issued massage options in the community for the patient.    Soft tissue mobilization CERVICAL BASE OF SKULL  GENTLE STRETCHESto pec,   shoulder depression.                  PT Education - 07/19/16 1354    Education provided Yes   Education Details Self care   Person(s) Educated Patient   Methods Explanation;Handout   Comprehension Verbalized understanding          PT Short Term Goals - 07/19/16 1155      PT SHORT TERM GOAL #1   Title Pt will demo at least 10 deg ROM in sidebend and rotation planes by 3/9   Time 3   Period Weeks   Status Achieved       PT SHORT TERM GOAL #2   Title Pt will verbalize average resting pain <=4/10 with daily activities   Baseline 0 to 7/10 resting pain ( after exercise classes.)   Time 3   Period Weeks   Status Partially Met           PT Long Term Goals - 07/19/16 1157      PT LONG TERM GOAL #1   Title Pt will be able to perform all child care activities pain <=2/10 by 3/30   Baseline patient does not do a lot of child care, her Kids are older,  Difficult to get her to answer.    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    Time 6   Period Weeks   Status On-going     PT LONG TERM GOAL #4   Title FOTO to 68% ability to  indicate significant improvement in functional ability   Time 6   Period Weeks   Status Unable to assess     PT LONG TERM GOAL #5   Title Pt will be able to sit still for at least 20 min without limitation by pain to complete daily activities   Baseline Needs to squirm,  pain is not limiting,  moments of discomfort,  Sitting 2-3 hours    Time 6   Period Weeks   Status Partially Met               Plan - 07/19/16 1348    Clinical Impression Statement LTG#2, #5 partially met.  pain is improving in general.  Low back spasms grab without warning.  Patient able to sit 2-3 hours in church as long as she squirms,  pain does not limit.    PT Next Visit Plan core strengthening, re-eval    FOTO    PT Home Exercise Plan scapular retraction, cervical flexion with rotation,  sUPINE SCAPULAR STABILIZATION,  [place], supine pelvic tilt + leg ext   Consulted and Agree with Plan of Care Patient      Patient will benefit from skilled therapeutic intervention in order to improve the following deficits and impairments:  Decreased range of motion, Increased fascial restricitons, Increased muscle spasms, Decreased activity tolerance, Pain, Improper body mechanics, Decreased strength, Postural dysfunction  Visit Diagnosis: Cervicalgia  Pain in thoracic spine     Problem List There are no active problems to display for this patient.   HARRIS,KAREN  PTA 07/19/2016, 1:55 PM  [facility] [address] [place], , [address] Phone: [phone]   Fax:  [phone]  Name: Olivia Harrison MRN: [id number] Date of Birth: 09/13/1980   

## 2016-07-19 NOTE — Patient Instructions (Addendum)
Massage options in community,   From file above shred cabinet. Where to get Bolsters Verbal (online) ADL options for lifting,  Hanging curtains ,

## 2016-07-21 ENCOUNTER — Ambulatory Visit: Payer: 59 | Admitting: Physical Therapy

## 2016-07-21 ENCOUNTER — Encounter: Payer: Self-pay | Admitting: Physical Therapy

## 2016-07-21 DIAGNOSIS — M542 Cervicalgia: Secondary | ICD-10-CM | POA: Diagnosis not present

## 2016-07-21 DIAGNOSIS — M546 Pain in thoracic spine: Secondary | ICD-10-CM

## 2016-07-21 NOTE — Therapy (Signed)
[facility] [address] [place], [place], [address] Phone: [phone]   Fax:  [phone]  Physical Therapy Treatment  Patient Details  Name: Olivia Harrison MRN: [id number] Date of Birth: July 27, 1980 Referring Provider: Doyle Askew, FNP  Encounter Date: 07/21/2016      PT End of Session - 07/21/16 1229    Visit Number 9   Number of Visits 13   Date for PT Re-Evaluation 07/30/16   PT Start Time 0808   PT Stop Time 0900   PT Time Calculation (min) 52 min   Activity Tolerance Patient tolerated treatment well   Behavior During Therapy [facility] for tasks assessed/performed      Past Medical History:  Diagnosis Date  . High cholesterol     Past Surgical History:  Procedure Laterality Date  . CESAREAN SECTION  2009   x1  . HYSTEROSCOPY  08/2009   polyps  . INDUCED ABORTION    . TUBAL LIGATION  2009  . VAGINAL DELIVERY     x3    There were no vitals filed for this visit.      Subjective Assessment - 07/21/16 0811    Subjective Pain 3/10.  Sinus issues mainly   Pain Score 3    Pain Location Neck   Pain Orientation Right;Left   Pain Score 3   Pain Location Back                         [facility] Adult PT Treatment/Exercise - 07/21/16 0001      Neck Exercises: Machines for Strengthening   UBE (Upper Arm Bike) 5 minutes     Neck Exercises: Standing   Other Standing Exercises with 2 LB wrist weight:  4 positions 3 0 seconds 4 ways     Neck Exercises: Prone   Other Prone Exercise "T, Y's"  thumbs up and out 10 X each,  cues     Lumbar Exercises: Supine   Bridge 10 reps   Other Supine Lumbar Exercises Scissors Level 1 and level2 challanging      Knee/Hip Exercises: Standing   Other Standing Knee Exercises squat 10 X, Hip hinge 10 X cues for head position.     Shoulder Exercises: Standing   Other Standing Exercises wall wipes with 2 LBS 30 seconds 4 ways each arm     Moist Heat  Therapy   Number Minutes Moist Heat 15 Minutes   Moist Heat Location Cervical;Lumbar Spine                  PT Short Term Goals - 07/19/16 1155      PT SHORT TERM GOAL #1   Title Pt will demo at least 10 deg ROM in sidebend and rotation planes by 3/9   Time 3   Period Weeks   Status Achieved     PT SHORT TERM GOAL #2   Title Pt will verbalize average resting pain <=4/10 with daily activities   Baseline 0 to 7/10 resting pain ( after exercise classes.)   Time 3   Period Weeks   Status Partially Met           PT Long Term Goals - 07/19/16 1157      PT LONG TERM GOAL #1   Title Pt will be able to perform all child care activities pain <=2/10 by 3/30   Baseline patient does not do a lot of child care, her Kids are  older,  Difficult to get her to answer.    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    Time 6   Period Weeks   Status On-going     PT LONG TERM GOAL #4   Title FOTO to 68% ability to indicate significant improvement in functional ability   Time 6   Period Weeks   Status Unable to assess     PT LONG TERM GOAL #5   Title Pt will be able to sit still for at least 20 min without limitation by pain to complete daily activities   Baseline Needs to squirm,  pain is not limiting,  moments of discomfort,  Sitting 2-3 hours    Time 6   Period Weeks   Status Partially Met               Plan - 07/21/16 1229    Clinical Impression Statement Strengthening focus today.  Patient working hard to develop core to decrease stress during her exercise classes.   PT  Next Visit Plan FOTO 10th visit.  Re-eval,  core consider planks.   PT Home Exercise Plan scapular retraction, cervical flexion with rotation,  sUPINE SCAPULAR STABILIZATION,  [place], supine pelvic tilt + leg ext   Consulted and Agree with Plan of Care Patient      Patient will benefit from skilled therapeutic intervention in order to improve the following deficits and impairments:  Decreased range of motion, Increased fascial restricitons, Increased muscle spasms, Decreased activity tolerance, Pain, Improper body mechanics, Decreased strength, Postural dysfunction  Visit Diagnosis: Cervicalgia  Pain in thoracic spine     Problem List There are no active problems to display for this patient.   Ramiro Pangilinan PTA 07/21/2016, 12:32 PM  [facility] [address] [place], [place], [address] Phone: [phone]   Fax:  [phone]  Name: KERSTIN CRUSOE MRN: [id number] Date of Birth: March 19, 1981

## 2016-07-27 ENCOUNTER — Ambulatory Visit: Payer: 59 | Admitting: Physical Therapy

## 2016-07-27 ENCOUNTER — Encounter: Payer: Self-pay | Admitting: Physical Therapy

## 2016-07-27 DIAGNOSIS — M542 Cervicalgia: Secondary | ICD-10-CM

## 2016-07-27 DIAGNOSIS — M546 Pain in thoracic spine: Secondary | ICD-10-CM

## 2016-07-27 NOTE — Therapy (Signed)
[place], [place], [address] Phone: [phone]   Fax:  [phone]  Physical Therapy Treatment  Patient Details  Name: Olivia Harrison MRN: [id number] Date of Birth: December 13, 1980 Referring Provider: Sedalia Muta, FNP  Encounter Date: 07/27/2016      PT End of Session - 07/27/16 1356    Visit Number 10   Number of Visits 13   Date for PT Re-Evaluation 07/30/16   PT Start Time 0740   PT Stop Time 0820   PT Time Calculation (min) 40 min   Activity Tolerance Patient tolerated treatment well   Behavior During Therapy [facility] for tasks assessed/performed      Past Medical History:  Diagnosis Date  . High cholesterol     Past Surgical History:  Procedure Laterality Date  . CESAREAN SECTION  2009   x1  . HYSTEROSCOPY  08/2009   polyps  . INDUCED ABORTION    . TUBAL LIGATION  2009  . VAGINAL DELIVERY     x3    There were no vitals filed for this visit.      Subjective Assessment - 07/27/16 0744    Subjective Resting pain 4/10.  No spasms since last visit.   Currently in Pain? Yes   Pain Score 4    Pain Location Neck   Pain Orientation Right;Left   Pain Descriptors / Indicators Aching   Aggravating Factors  with resting,  hard to get comfortable   Pain Relieving Factors supine holding abdominals tight   Multiple Pain Sites --  plantar fascitis 5/10 left   Aggravating Factors  Longer standing, longer sitting   Effect of Pain on Daily Activities change of position,  being more active.                         [facility] Adult PT Treatment/Exercise - 07/27/16 0001      Neck Exercises: Sidelying   Other Sidelying Exercise 3 X each. Plank from knees. 18-24 seconds each.  Knee limited right side X 1.      Neck Exercises: Prone   Plank 2 X 60,34, 29 seconds   Other Prone Exercise "T, Y's"  thumbs up and out 10 X each,  cues     Moist Heat Therapy   Number Minutes  Moist Heat 15 Minutes   Moist Heat Location Cervical;Lumbar Spine                  PT Short Term Goals - 07/19/16 1155      PT SHORT TERM GOAL #1   Title Pt will demo at least 10 deg ROM in sidebend and rotation planes by 3/9   Time 3   Period Weeks   Status Achieved     PT SHORT TERM GOAL #2   Title Pt will verbalize average resting pain <=4/10 with daily activities   Baseline 0 to 7/10 resting pain ( after exercise classes.)   Time 3   Period Weeks   Status Partially Met           PT Long Term Goals - 07/19/16 1157      PT LONG TERM GOAL #1   Title Pt will be able to perform all child care activities pain <=2/10 by 3/30   Baseline patient does not do a lot of child care, her Kids are older,  Difficult to get her to answer.    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 Neck ROM WNL. FOTO72% ability.   PT Next Visit Plan   Re-eval,  core  continue planks.   PT Home Exercise Plan scapular retraction, cervical flexion with rotation,  sUPINE SCAPULAR STABILIZATION,  [place], supine pelvic tilt + leg ext   Consulted and Agree with Plan of Care Patient      Patient will benefit from skilled therapeutic intervention in order to improve the following deficits and impairments:  Decreased range of motion, Increased fascial restricitons, Increased muscle spasms, Decreased activity tolerance, Pain, Improper body mechanics, Decreased strength, Postural dysfunction  Visit Diagnosis: Cervicalgia  Pain in thoracic spine     Problem List There are no active problems to display for this patient.   Kysen Wetherington PTA 07/27/2016, 2:02 PM  [facility] [address] [place], [place], [address] Phone: [phone]   Fax:  [phone]  Name: JODILYN GIESE MRN: [id number] Date of Birth: [birth year]

## 2016-07-29 ENCOUNTER — Encounter: Payer: Self-pay | Admitting: Physical Therapy

## 2016-07-29 ENCOUNTER — Ambulatory Visit: Payer: 59 | Admitting: Physical Therapy

## 2016-07-29 DIAGNOSIS — M542 Cervicalgia: Secondary | ICD-10-CM

## 2016-07-29 DIAGNOSIS — M546 Pain in thoracic spine: Secondary | ICD-10-CM

## 2016-07-29 NOTE — Patient Instructions (Signed)
Self snags from exercise drawer PRN 1 to 5 reps first and last issued

## 2016-07-29 NOTE — Therapy (Signed)
[place], [place], [address] Phone: [phone]   Fax:  [phone]  Physical Therapy Treatment  Patient Details  Name: Olivia Harrison MRN: [id number] Date of Birth: 05/29/1980 Referring Provider: Sedalia Muta, FNP  Encounter Date: 07/29/2016      PT End of Session - 07/29/16 1838    Visit Number 11   Number of Visits 13   Date for PT Re-Evaluation 07/30/16   PT Start Time 1146   PT Stop Time 1250   PT Time Calculation (min) 64 min   Activity Tolerance Patient tolerated treatment well   Behavior During Therapy [facility] for tasks assessed/performed      Past Medical History:  Diagnosis Date  . High cholesterol     Past Surgical History:  Procedure Laterality Date  . CESAREAN SECTION  2009   x1  . HYSTEROSCOPY  08/2009   polyps  . INDUCED ABORTION    . TUBAL LIGATION  2009  . VAGINAL DELIVERY     x3    There were no vitals filed for this visit.      Subjective Assessment - 07/29/16 1151    Currently in Pain? Yes   Pain Score 4    Pain Location Neck   Pain Orientation Right;Left   Pain Descriptors / Indicators --  discomfort vs pain   Aggravating Factors  Turning head to the side for awhile,    Pain Relieving Factors change of position    Pain Score --  Has pain at night as high as a 7/10(2-3 nights a week)   Pain Orientation Mid;Upper;Right;Left   Aggravating Factors  nights after being really active            [facility] PT Assessment - 07/29/16 0001      Observation/Other Assessments   Focus on Therapeutic Outcomes (FOTO)  72% ability (goal 68)     AROM   Cervical - Right Rotation 70   Cervical - Left Rotation 69                     [facility] Adult PT Treatment/Exercise - 07/29/16 0001      Self-Care   Self-Care Other Self-Care Comments  tennis balls-trigger point base of skull.  Demo/issued   Other Self-Care Comments  how to ease back into  exercise routine.  Use good pposture with ADL's     Therapeutic Activites    Therapeutic Activities Lifting;Work Art therapist able to carry , lift black ball correctly from floor, from mat,   Work Simulation sled push pull in gym with correct posture.  to simulate mowing.      Neck Exercises: Seated   Other Seated Exercise Neck self snag, sitting with good posture drop chin toward chest,  use both hands to add stretch if needed,  demonstrated vs having patient practice     Moist Heat Therapy   Number Minutes Moist Heat 15 Minutes   Moist Heat Location Cervical;Lumbar Spine  mid back     Manual Therapy   Manual Therapy Soft tissue mobilization   Soft tissue mobilization mid back to base of skull  SCM sensitive,  paraspinals tender,  upper traps tight.                 PT Education - 07/29/16 1838    Education provided Yes   Education Details HEP   Person(s) Educated Patient   Methods Explanation;Demonstration;Verbal cues;Handout   Comprehension  Verbalized understanding          PT Short Term Goals - 07/29/16 1201      PT SHORT TERM GOAL #1   Title Pt will demo at least 10 deg ROM in sidebend and rotation planes by 3/9   Time 3   Period Weeks   Status Achieved     PT SHORT TERM GOAL #2   Title Pt will verbalize average resting pain <=4/10 with daily activities   Baseline 2/10   Time 3   Period Weeks   Status Achieved           PT Long Term Goals - 07/29/16 1202      PT LONG TERM GOAL #1   Title Pt will be able to perform all child care activities pain <=2/10 by 3/30   Baseline 2/10   Time 6   Period Weeks   Status Achieved     PT LONG TERM GOAL #2   Title Pt will be able to perform cerivcal rotation without feeling limited by tightness/pain   Baseline has irritation with turning and holding   Time 6   Period Weeks   Status Partially Met     PT LONG TERM GOAL #3   Title 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, pushing and other exercise 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  Her sitting still is limited to 10 miniutes.  She has not been able to return to her normal exercise DVD.  She is not lifting heavy chicken feed bags.   Her POC ends tomorrow.     PT Next Visit Plan   Re-eval,  core continue planks.  consider lifting heavier items?   PT Home Exercise Plan scapular retraction, cervical flexion with rotation,  sUPINE SCAPULAR STABILIZATION,  [place], supine pelvic tilt + leg ext   Consulted and Agree with Plan of Care Patient      Patient will benefit from skilled therapeutic intervention in order to improve the following deficits and impairments:  Decreased range of motion, Increased fascial restricitons, Increased muscle spasms, Decreased activity tolerance, Pain, Improper body mechanics, Decreased strength, Postural dysfunction  Visit Diagnosis: Cervicalgia  Pain in thoracic spine     Problem List There are no active problems to display for this patient.   Si Jachim PTA 07/29/2016, 6:42 PM  [facility] [address] [place], [place], [address] Phone: [phone]   Fax:  [phone]  Name: Olivia Harrison MRN: [id number] Date of Birth: Jul 19, 1980

## 2016-08-02 DIAGNOSIS — I809 Phlebitis and thrombophlebitis of unspecified site: Secondary | ICD-10-CM | POA: Diagnosis not present

## 2016-08-02 DIAGNOSIS — M79605 Pain in left leg: Secondary | ICD-10-CM | POA: Diagnosis not present

## 2016-08-02 DIAGNOSIS — I82612 Acute embolism and thrombosis of superficial veins of left upper extremity: Secondary | ICD-10-CM | POA: Diagnosis not present

## 2016-08-04 ENCOUNTER — Telehealth: Payer: Self-pay | Admitting: Physician Assistant

## 2016-08-04 ENCOUNTER — Ambulatory Visit: Payer: Self-pay | Admitting: Neurology

## 2016-08-04 NOTE — Telephone Encounter (Signed)
Pt is wanting a note stating-she is under the care of a PT and she is not in a place where she can preform her duties in her workout program-[employer]. This is concerning  a visit she had with Olivia Harrison on 05/25/16. I let her know she would more than likely need an OV for this. Please advise at [phone]

## 2016-08-05 NOTE — Telephone Encounter (Signed)
Pt has been under the care of physical therapy for two months and I do not see any prior notes indicating that I took the patient out of work. If she is unable to work , she would need an OV along with review of notes from physical therapy.  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

## 2016-08-05 NOTE — Telephone Encounter (Signed)
Last seen 05/25/16  Ok to write note? Ov?

## 2016-08-05 NOTE — Telephone Encounter (Signed)
Pt advised of kims note and will call back for an appt.

## 2016-08-09 ENCOUNTER — Encounter: Payer: Self-pay | Admitting: Physical Therapy

## 2016-08-09 ENCOUNTER — Ambulatory Visit: Payer: 59 | Attending: Family Medicine | Admitting: Physical Therapy

## 2016-08-09 DIAGNOSIS — M546 Pain in thoracic spine: Secondary | ICD-10-CM | POA: Diagnosis not present

## 2016-08-09 DIAGNOSIS — M542 Cervicalgia: Secondary | ICD-10-CM

## 2016-08-09 NOTE — Therapy (Signed)
[place], [place], [address] Phone: [phone]   Fax:  [phone]  Physical Therapy Treatment  Patient Details  Name: Olivia Harrison MRN: [id number] Date of Birth: 1981-04-20 Referring Provider: Sedalia Muta, FNP  Encounter Date: 08/09/2016      PT End of Session - 08/09/16 0937    Visit Number 12   Number of Visits 13   Date for PT Re-Evaluation 09/10/16   Authorization Type UHC   PT Start Time 0937   PT Stop Time 1019   PT Time Calculation (min) 42 min   Activity Tolerance Patient tolerated treatment well   Behavior During Therapy [facility] for tasks assessed/performed      Past Medical History:  Diagnosis Date  . High cholesterol     Past Surgical History:  Procedure Laterality Date  . CESAREAN SECTION  2009   x1  . HYSTEROSCOPY  08/2009   polyps  . INDUCED ABORTION    . TUBAL LIGATION  2009  . VAGINAL DELIVERY     x3    There were no vitals filed for this visit.      Subjective Assessment - 08/09/16 0937    Subjective Pt reports being busy with household chores this weekend. does not feel like the neck is postural related, feels more that it is overworked. Overall feels that she has more mobility in her neck, pelvic tilting has helped with prolonged postioning. Fixed her friend's hair which caused her neck and shoulders. Unable to take pain medications due to aspirin regimen that recently started from 2 DVTs in L ankle.    How long can you sit comfortably? 15 min   Patient Stated Goals body pump certification, core exercises (work related activities)   Currently in Pain? Yes   Pain Score 5    Pain Location Neck   Pain Orientation Right;Left;Upper   Pain Descriptors / Indicators Tightness   Pain Relieving Factors stretching   Pain Location Back   Pain Orientation Mid   Pain Descriptors / Indicators --  over worked   Pain Type Chronic pain   Aggravating Factors   bending/reaching for chicks at home   Pain Relieving Factors massage, heat, sitting with correct posture            [facility] PT Assessment - 08/09/16 0001      Assessment   Medical Diagnosis LBP without sciatica, neck pain   Referring Provider Sedalia Muta, FNP   Onset Date/Surgical Date 03/06/16     Palpation   Palpation comment suboccipitals & bilat upper trap TTP-concordant pain                     [facility] Adult PT Treatment/Exercise - 08/09/16 0001      Neck Exercises: Supine   Other Supine Exercise form with crunches- multiple arm direction                PT Education - 08/09/16 1253    Education provided Yes   Education Details anatomy of condition, lacking injury-pain from biomechanical chain, return to work. exercise form with work specific activities & household chores   Person(s) Educated Patient   Methods Explanation;Demonstration;Tactile cues;Verbal cues   Comprehension Verbalized understanding;Returned demonstration;Verbal cues required;Tactile cues required;Need further instruction          PT Short Term Goals - 07/29/16 1201      PT SHORT TERM GOAL #1   Title Pt will demo at  least 10 deg ROM in sidebend and rotation planes by 3/9   Time 3   Period Weeks   Status Achieved     PT SHORT TERM GOAL #2   Title Pt will verbalize average resting pain <=4/10 with daily activities   Baseline 2/10   Time 3   Period Weeks   Status Achieved           PT Long Term Goals - 08/09/16 1300      PT LONG TERM GOAL #1   Title Pt will be able to perform all child care activities pain <=2/10 by 3/30   Baseline 2/10   Status Achieved     PT LONG TERM GOAL #2   Title Pt will be able to perform cerivcal rotation without feeling limited by tightness/pain   Baseline has irritation with turning and holding but is much better, reports this irritation is more occasional and activity dependent   Status Partially Met     PT LONG TERM  GOAL #3   Title 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, 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   Baseline Able to push/pull, requires further instruction in work specific lifting   Status On-going     PT [place] #4   Title FOTO to 68% ability to indicate significant improvement in functional ability   Baseline 72% ability   Status Achieved     PT LONG TERM GOAL #5   Title Pt will be able to sit still for at least 20 min without limitation by pain to complete daily activities   Baseline 15 min   Status On-going               Plan - 08/09/16 1254    Clinical Impression Statement Pt verbalized improvement in overall mobility, function and pain levels but 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 Pt demo poor form with abdominal exercises as well as lifting, overusing upper trapezius and causing concordant pain. Pt will benefit from skilled PT in order to correct form with work-specific activities so she is able to return to PLOF.    PT Frequency --  2/week 2 weeks; 1/week 3 weeks following   PT Treatment/Interventions ADLs/Self Care Home Management;Cryotherapy;Electrical Stimulation;Iontophoresis '4mg'$ /ml Dexamethasone;Functional mobility training;Ultrasound;Traction;Moist Heat;Therapeutic activities;Therapeutic exercise;Neuromuscular re-education;Patient/family education;Passive range of motion;Manual techniques;Dry needling;Taping   PT Next Visit Plan work specific form   PT Home Exercise Plan scapular retraction, cervical flexion with rotation,  sUPINE SCAPULAR STABILIZATION,  [place], supine pelvic tilt + leg ext; reducing forward head in crunches and sit ups with arms OH   Consulted and Agree with Plan of Care Patient      Patient will benefit from skilled therapeutic intervention in order to improve the following deficits and impairments:  Decreased range of motion, Increased fascial restricitons, Increased muscle spasms,  Decreased activity tolerance, Pain, Improper body mechanics, Decreased strength, Postural dysfunction  Visit Diagnosis: Cervicalgia - Plan: PT plan of care cert/re-cert  Pain in thoracic spine - Plan: PT plan of care cert/re-cert     Problem List There are no active problems to display for this patient.   Ivi Griffith C. Loralai Eisman PT, DPT 08/09/16 1:05 PM   [facility] [address] [place], [place], [address] Phone: [phone]   Fax:  [phone]  Name: Olivia Harrison MRN: [id number] Date of Birth: 03/20/1981

## 2016-08-11 ENCOUNTER — Encounter: Payer: Self-pay | Admitting: Physical Therapy

## 2016-08-11 ENCOUNTER — Ambulatory Visit: Payer: 59 | Admitting: Physical Therapy

## 2016-08-11 DIAGNOSIS — M542 Cervicalgia: Secondary | ICD-10-CM

## 2016-08-11 DIAGNOSIS — M546 Pain in thoracic spine: Secondary | ICD-10-CM

## 2016-08-11 NOTE — Therapy (Signed)
[place], [place], [address] Phone: [phone]   Fax:  [phone]  Physical Therapy Treatment  Patient Details  Name: Olivia Harrison MRN: [id number] Date of Birth: 07/11/1980 Referring Provider: Sedalia Muta, FNP  Encounter Date: 08/11/2016      PT End of Session - 08/11/16 1150    Visit Number 13   Number of Visits 19   Date for PT Re-Evaluation 09/10/16   PT Start Time 1150   PT Stop Time 1231   PT Time Calculation (min) 41 min   Activity Tolerance Patient tolerated treatment well   Behavior During Therapy [facility] for tasks assessed/performed      Past Medical History:  Diagnosis Date  . High cholesterol     Past Surgical History:  Procedure Laterality Date  . CESAREAN SECTION  2009   x1  . HYSTEROSCOPY  08/2009   polyps  . INDUCED ABORTION    . TUBAL LIGATION  2009  . VAGINAL DELIVERY     x3    There were no vitals filed for this visit.      Subjective Assessment - 08/11/16 1150    Subjective Pt reports practicing crunching position which has been difficult but feels like she is getting better. 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 Thinks she over-corrects when she sits.    Patient Stated Goals body pump certification, core exercises (work related activities)   Currently in Pain? No/denies                         [facility] Adult PT Treatment/Exercise - 08/11/16 0001      Lumbar Exercises: Supine   AB Set Limitations ab set with rib cage depression   Other Supine Lumbar Exercises crunch form     Knee/Hip Exercises: Stretches   Other Knee/Hip Stretches cat/camel/child pose; modified in standing   Other Knee/Hip Stretches open book; modified in standing & kneeling     Knee/Hip Exercises: Aerobic   Elliptical 5 min L1     Knee/Hip Exercises: Standing   Other Standing Knee Exercises abdominal engagement in dancing                 PT Education - 08/11/16 1154    Education provided Yes   Education Details exercise form/rationale, HEP, costovertebral joint movement   Person(s) Educated Patient   Methods Explanation;Demonstration;Verbal cues;Tactile cues   Comprehension Verbalized understanding;Returned demonstration;Verbal cues required;Tactile cues required;Need further instruction          PT Short Term Goals - 07/29/16 1201      PT SHORT TERM GOAL #1   Title Pt will demo at least 10 deg ROM in sidebend and rotation planes by 3/9   Time 3   Period Weeks   Status Achieved     PT SHORT TERM GOAL #2   Title Pt will verbalize average resting pain <=4/10 with daily activities   Baseline 2/10   Time 3   Period Weeks   Status Achieved           PT Long Term Goals - 08/09/16 1300      PT LONG TERM GOAL #1   Title Pt will be able to perform all child care activities pain <=2/10 by 3/30   Baseline 2/10   Status Achieved     PT LONG TERM GOAL #2   Title Pt will be able to perform cerivcal  rotation without feeling limited by tightness/pain   Baseline has irritation with turning and holding but is much better, reports this irritation is more occasional and activity dependent   Status Partially Met     PT LONG TERM GOAL #3   Title 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, 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   Baseline Able to push/pull, requires further instruction in work specific lifting   Status On-going     PT [place] #4   Title FOTO to 68% ability to indicate significant improvement in functional ability   Baseline 72% ability   Status Achieved     PT LONG TERM GOAL #5   Title Pt will be able to sit still for at least 20 min without limitation by pain to complete daily activities   Baseline 15 min   Status On-going               Plan - 08/11/16 1248    Clinical Impression Statement Notable rib cage flare with difficulty  reducing resulted in lack of thoracic flexion to allow crunch to stem from work in abdominal wall. Pt reported feeling mobilization costovertebral joints which was slightly painful. Discussed mobility of those joints and stretching of small muscles surrounding them. 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    PT Next Visit Plan work specific form, core engagement in lifting   Consulted and Agree with Plan of Care Patient      Patient will benefit from skilled therapeutic intervention in order to improve the following deficits and impairments:     Visit Diagnosis: Cervicalgia  Pain in thoracic spine     Problem List There are no active problems to display for this patient.   Mekisha Bittel C. Jayanth Szczesniak PT, DPT 08/11/16 12:56 PM    [facility] [address] [place], [place], [address] Phone: [phone]   Fax:  [phone]  Name: Olivia Harrison MRN: [id number] Date of Birth: November 28, 1980

## 2016-08-16 ENCOUNTER — Ambulatory Visit: Payer: 59 | Admitting: Physical Therapy

## 2016-08-19 ENCOUNTER — Ambulatory Visit: Payer: 59 | Admitting: Physical Therapy

## 2016-08-19 ENCOUNTER — Encounter: Payer: Self-pay | Admitting: Physical Therapy

## 2016-08-19 DIAGNOSIS — M546 Pain in thoracic spine: Secondary | ICD-10-CM

## 2016-08-19 DIAGNOSIS — I8290 Acute embolism and thrombosis of unspecified vein: Secondary | ICD-10-CM | POA: Diagnosis not present

## 2016-08-19 DIAGNOSIS — M542 Cervicalgia: Secondary | ICD-10-CM | POA: Diagnosis not present
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
## 2016-08-23 DIAGNOSIS — S060X0A Concussion without loss of consciousness, initial encounter: Secondary | ICD-10-CM | POA: Diagnosis not present

## 2016-08-23 DIAGNOSIS — R413 Other amnesia: Secondary | ICD-10-CM | POA: Diagnosis not present

## 2016-08-26 ENCOUNTER — Ambulatory Visit: Payer: 59 | Admitting: Physical Therapy

## 2016-08-26 ENCOUNTER — Encounter: Payer: Self-pay | Admitting: Physical Therapy

## 2016-08-26 DIAGNOSIS — M546 Pain in thoracic spine: Secondary | ICD-10-CM

## 2016-08-26 DIAGNOSIS — M542 Cervicalgia: Secondary | ICD-10-CM | POA: Diagnosis not present
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
## 2016-09-02 ENCOUNTER — Ambulatory Visit: Payer: 59 | Admitting: Physical Therapy

## 2016-09-09 ENCOUNTER — Encounter: Payer: 59 | Admitting: Physical Therapy

## 2016-09-09 ENCOUNTER — Ambulatory Visit: Payer: 59 | Admitting: Physical Therapy

## 2016-09-10 DIAGNOSIS — Z630 Problems in relationship with spouse or partner: Secondary | ICD-10-CM | POA: Diagnosis not present

## 2016-09-15 DIAGNOSIS — M79605 Pain in left leg: Secondary | ICD-10-CM | POA: Diagnosis not present

## 2016-09-21 ENCOUNTER — Ambulatory Visit: Payer: 59 | Attending: Physical Medicine and Rehabilitation | Admitting: Speech Pathology

## 2016-09-21 DIAGNOSIS — R41841 Cognitive communication deficit: Secondary | ICD-10-CM | POA: Diagnosis not present
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
## 2016-09-21 NOTE — Patient Instructions (Signed)
  Memory Strategies  W - Write it down  A - Associate it with something  R - Repeat it  M - Mental Image     Play the memory game  Try to remember 3-5 items on your store list without looking  Study a detailed picture in a magazine for 1 minute, then write down everything you can remember from the picture      

## 2016-09-22 DIAGNOSIS — M79605 Pain in left leg: Secondary | ICD-10-CM | POA: Diagnosis not present

## 2016-09-27 DIAGNOSIS — Z630 Problems in relationship with spouse or partner: Secondary | ICD-10-CM | POA: Diagnosis not present

## 2016-10-04 ENCOUNTER — Ambulatory Visit: Payer: 59 | Attending: Physical Medicine and Rehabilitation | Admitting: Speech Pathology

## 2016-10-04 DIAGNOSIS — R41841 Cognitive communication deficit: Secondary | ICD-10-CM | POA: Insufficient documentation

## 2016-10-04 NOTE — Therapy (Signed)
[facility] [address] [place], [place], [address] Phone: [phone]   Fax:  [phone]  Speech Language Pathology Treatment  Patient Details  Name: Olivia Harrison MRN: [id number] Date of Birth: 1980/10/02 Referring Provider: Lubertha South, MD  Encounter Date: 10/04/2016      End of Session - 10/04/16 0911    Visit Number 2   Number of Visits 17   Date for SLP Re-Evaluation 11/19/16   Authorization Type no auth req   Authorization Time Period 60 visits comb   SLP Start Time 0802   SLP Stop Time  0844   SLP Time Calculation (min) 42 min   Activity Tolerance Patient limited by fatigue      Past Medical History:  Diagnosis Date  . High cholesterol     Past Surgical History:  Procedure Laterality Date  . CESAREAN SECTION  2009   x1  . HYSTEROSCOPY  08/2009   polyps  . INDUCED ABORTION    . TUBAL LIGATION  2009  . VAGINAL DELIVERY     x3    There were no vitals filed for this visit.      Subjective Assessment - 10/04/16 0804    Subjective "I'm glad I looked at it... I would have forgotten this appointment." re; day planner   Currently in Pain? Yes   Pain Score 4    Pain Location Back   Pain Orientation Mid   Pain Descriptors / Indicators Sore   Pain Type Acute pain   Pain Onset In the past 7 days   Aggravating Factors  helped a friend move over the weekend   Pain Relieving Factors Heating pad   Multiple Pain Sites No               ADULT SLP TREATMENT - 10/04/16 0806      General Information   Behavior/Cognition Alert;Cooperative   Patient Positioning Upright in chair   Oral care provided N/A     Treatment Provided   Treatment provided Cognitive-Linquistic     Cognitive-Linquistic Treatment   Treatment focused on Cognition   Skilled Treatment SLP reviewed results of previous session's evaluation and discussed plan of care, treatment goals with pt. Pt arrived with day  planner, states she has begun recording activities but "I need to remember to look at it." SLP provided education, training in strategies such as building routines to incorporate use of external aids. With min verbal cues pt identified appropriate time of day to review her planner and set alert in her phone for a "wind down" period before bed to review planned items for the current and following day. Divergent naming, synomym/antonym tasks assigned for home exercise.     Assessment / Recommendations / Plan   Plan Continue with current plan of care     Progression Toward Goals   Progression toward goals Progressing toward goals          SLP Education - 10/04/16 0911    Education provided Yes   Education Details routines, reviewing planner at specific times each day   Person(s) Educated Patient   Methods Explanation;Demonstration;Verbal cues   Comprehension Verbalized understanding;Returned demonstration;Need further instruction          SLP Short Term Goals - 10/04/16 0915      SLP SHORT TERM GOAL #1   Title Pt will attend to details in mildly complex cognitive linguistic tasks with 85% accuracy and occasional min A   Time 4  Period Weeks   Status On-going     SLP SHORT TERM GOAL #2   Title Pt will complete various naming tasks of increasing complexity with 90% success over 2 sessions, given mod assist   Time 4   Period Weeks   Status On-going     SLP SHORT TERM GOAL #3   Title Pt will implement and use a system for functional recall of daily and scheduled activities with mod assistance over 2 sessions   Time 4   Period Weeks   Status On-going          SLP Long Term Goals - 10/04/16 0916      SLP LONG TERM GOAL #1   Title Pt will alternate attention between 2 mildly complex cognitive linguistic tasks with 85% on each and occasional min A   Time 8   Period Weeks   Status On-going     SLP LONG TERM GOAL #2   Title Pt will complete Level III (abstract) naming  tasks with 90% accuracy without cues   Time 8   Period Weeks   Status On-going     SLP LONG TERM GOAL #3   Title Pt will report daily functional and effective use of compensatory strategies for recall of daily activities and appointments.    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 She reports continued difficulty with word-finding in "adult conversations," which she feels is embarrassing. She appears eager to utilize external aids, strategies to improve recall and organization of scheduled activities, and will benefit from continued training to improve carryover. Recommend skilled ST in order to maximize cognitive and communicative function and improve quality of life.   Speech Therapy Frequency 2x / week   Treatment/Interventions Language facilitation;SLP instruction and feedback;Compensatory techniques;Cognitive reorganization;Functional tasks;Internal/external aids;Patient/family education;Multimodal communcation approach;Environmental controls   Potential to Achieve Goals Good   SLP Home Exercise Plan begin routine for reviewing planner at the end of the day, naming tasks   Consulted and Agree with Plan of Care Patient      Patient will benefit from skilled therapeutic intervention in order to improve the following deficits and impairments:   Cognitive communication deficit    Problem List There are no active problems to display for this patient.  Olivia Harrison, [place], CCC-SLP Speech-Language Pathologist  Olivia Harrison 10/04/2016, 9:17 AM  [facility] [address] [place], [place], [address] Phone: [phone]   Fax:  [phone]   Name: Olivia Harrison MRN: [id number] Date of Birth: 1981-04-26

## 2016-10-07 ENCOUNTER — Ambulatory Visit: Payer: 59 | Admitting: Speech Pathology

## 2016-10-07 DIAGNOSIS — R41841 Cognitive communication deficit: Secondary | ICD-10-CM

## 2016-10-07 NOTE — Therapy (Signed)
[facility] [address] [place], [place], [address] Phone: [phone]   Fax:  [phone]  Speech Language Pathology Treatment  Patient Details  Name: Olivia Harrison MRN: [id number] Date of Birth: 02-01-1981 Referring Provider: Lubertha SouthLee Thomas Shuping, MD  Encounter Date: 10/07/2016      End of Session - 10/07/16 1728    Visit Number 3   Number of Visits 17   Date for SLP Re-Evaluation 11/19/16   Authorization Type no auth req   Authorization Time Period 60 visits comb   SLP Start Time 1315   SLP Stop Time  1400   SLP Time Calculation (min) 45 min   Activity Tolerance Patient tolerated treatment well      Past Medical History:  Diagnosis Date  . High cholesterol     Past Surgical History:  Procedure Laterality Date  . CESAREAN SECTION  2009   x1  . HYSTEROSCOPY  08/2009   polyps  . INDUCED ABORTION    . TUBAL LIGATION  2009  . VAGINAL DELIVERY     x3    There were no vitals filed for this visit.      Subjective Assessment - 10/07/16 1317    Subjective "I just realized I haven't been putting the daily stuff, but it's OK, I haven't missed anything."   Currently in Pain? No/denies               ADULT SLP TREATMENT - 10/07/16 1317      General Information   Behavior/Cognition Alert;Cooperative   Patient Positioning Upright in chair   Oral care provided N/A     Treatment Provided   Treatment provided Cognitive-Linquistic     Pain Assessment   Pain Assessment No/denies pain     Cognitive-Linquistic Treatment   Treatment focused on Cognition   Skilled Treatment Pt states she has been enjoying new way of doing things. Glances at planner, states she remembered to do her homework by reviewing it during her "wind-down" time at night. With verbal cues, pt identified tasks she would like to add. Reviewed divergent naming and synonym/antonym tasks pt completed for home exercise, she completed  with 90% accuracy, required mod cues for error corrections. Mildly complex deductive reasoning tasks 100% accuracy with extended time and rare min A.      Assessment / Recommendations / Plan   Plan Continue with current plan of care     Progression Toward Goals   Progression toward goals Progressing toward goals          SLP Education - 10/07/16 1341    Education provided Yes   Education Details recording events/activities at the time information is received, daily to do lists   Person(s) Educated Patient   Methods Explanation;Verbal cues   Comprehension Verbalized understanding;Need further instruction          SLP Short Term Goals - 10/07/16 1333      SLP SHORT TERM GOAL #1   Title Pt will attend to details in mildly complex cognitive linguistic tasks with 85% accuracy and occasional min A   Time 3   Period Weeks   Status On-going     SLP SHORT TERM GOAL #2   Title Pt will complete various naming tasks of increasing complexity with 90% success over 2 sessions, given mod assist   Time 3   Period Weeks   Status On-going     SLP SHORT TERM GOAL #3   Title Pt will implement  and use a system for functional recall of daily and scheduled activities with mod assistance over 2 sessions   Time 3   Period Weeks   Status On-going          SLP Long Term Goals - 10/07/16 1340      SLP LONG TERM GOAL #1   Title Pt will alternate attention between 2 mildly complex cognitive linguistic tasks with 85% on each and occasional min A   Time 7   Period Weeks   Status On-going     SLP LONG TERM GOAL #2   Title Pt will complete Level III (abstract) naming tasks with 90% accuracy without cues   Time 7   Period Weeks   Status On-going     SLP LONG TERM GOAL #3   Title Pt will report daily functional and effective use of compensatory strategies for recall of daily activities and appointments.    Time 7   Period Weeks   Status On-going          Plan - 10/07/16 1452     Clinical Impression Statement Patient presents with mild cognitive communication deficit with impairments in attention, memory and language. Pt reports use of planner at home, though she has not yet demonstrated carryover of daily to do list. She appears eager to utilize external aids, strategies to improve recall and organization of scheduled activities, and will benefit from continued training to improve carryover. Recommend skilled ST in order to maximize cognitive and communicative function and improve quality of life.   Speech Therapy Frequency 2x / week   Treatment/Interventions Language facilitation;SLP instruction and feedback;Compensatory techniques;Cognitive reorganization;Functional tasks;Internal/external aids;Patient/family education;Multimodal communcation approach;Environmental controls   Potential to Achieve Goals Good   SLP Home Exercise Plan begin routine for reviewing planner at the end of the day, deductive reasoning puzzle   Consulted and Agree with Plan of Care Patient      Patient will benefit from skilled therapeutic intervention in order to improve the following deficits and impairments:   Cognitive communication deficit    Problem List There are no active problems to display for this patient.  Rondel Baton, MS, CCC-SLP Speech-Language Pathologist   Arlana Lindau 10/07/2016, 5:30 PM  [facility] [address] [place], [place], [address] Phone: [phone]   Fax:  [phone]   Name: Olivia Harrison MRN: [id number] Date of Birth: 11-16-1980

## 2016-10-11 ENCOUNTER — Ambulatory Visit: Payer: 59 | Admitting: Speech Pathology

## 2016-10-11 DIAGNOSIS — R41841 Cognitive communication deficit: Secondary | ICD-10-CM

## 2016-10-11 NOTE — Patient Instructions (Signed)
  Describing words  What group does it belong to?  What do I use it for?  Where can I find it?  What does it LOOK like?  What other words go with it?  What is the 1st sound of the word?   Many Ways to Communicate  Describe it Write it Draw it Gesture it Use related words  There's an App for that: Family Feud, Heads up, Stop-fun categories, What if, [employer]versation Ther[employer]Py

## 2016-10-11 NOTE — Therapy (Signed)
[facility] [address] [place], [place], [address] Phone: [phone]   Fax:  [phone]  Speech Language Pathology Treatment  Patient Details  Name: Olivia Harrison MRN: [id number] Date of Birth: [birth year] Referring Provider: Lubertha SouthLee Thomas Shuping, MD  Encounter Date: 10/11/2016      End of Session - 10/11/16 1144    Visit Number 4   Number of Visits 17   Date for SLP Re-Evaluation 11/19/16   Authorization Type no auth req   Authorization Time Period 60 visits comb   SLP Start Time 0803   SLP Stop Time  0845   SLP Time Calculation (min) 42 min   Activity Tolerance Patient tolerated treatment well      Past Medical History:  Diagnosis Date  . High cholesterol     Past Surgical History:  Procedure Laterality Date  . CESAREAN SECTION  2009   x1  . HYSTEROSCOPY  08/2009   polyps  . INDUCED ABORTION    . TUBAL LIGATION  2009  . VAGINAL DELIVERY     x3    There were no vitals filed for this visit.      Subjective Assessment - 10/11/16 0805    Subjective "I didn't write down to do the puzzle," pt states she remembered her homework last night while checking her planner   Currently in Pain? No/denies               ADULT SLP TREATMENT - 10/11/16 0805      General Information   Behavior/Cognition Alert;Cooperative   Patient Positioning Upright in chair   Oral care provided N/A     Treatment Provided   Treatment provided Cognitive-Linquistic     Pain Assessment   Pain Assessment No/denies pain     Cognitive-Linquistic Treatment   Treatment focused on Cognition   Skilled Treatment Pt arrives with planner, daily to-do lists completed and states she feels more organized. She has continued with her nightly "wind down" and is now carrying over incomplete tasks to subsequent days. During session, she independently records reminders on her planner to complete homework and other tasks. Targeted  alternating attention with wordfinding tasks, opposites and describing attributes with 90% accuracy on both tasks. Pt noted with anomia when attempting to describe common objects, SLP provided tips for anomia and verbal cues for description, related words.      Assessment / Recommendations / Plan   Plan Continue with current plan of care     Progression Toward Goals   Progression toward goals Progressing toward goals          SLP Education - 10/11/16 1143    Education provided Yes   Education Details compensations for anomia   Person(s) Educated Patient   Methods Explanation;Verbal cues;Demonstration   Comprehension Verbalized understanding;Returned demonstration;Need further instruction          SLP Short Term Goals - 10/11/16 0815      SLP SHORT TERM GOAL #1   Title Pt will attend to details in mildly complex cognitive linguistic tasks with 85% accuracy and occasional min A   Time 2   Period Weeks   Status On-going     SLP SHORT TERM GOAL #2   Title Pt will complete various naming tasks of increasing complexity with 90% success over 2 sessions, given mod assist   Time 2   Period Weeks   Status On-going     SLP SHORT TERM GOAL #3   Title  Pt will implement and use a system for functional recall of daily and scheduled activities with mod assistance over 2 sessions   Time 2   Period Weeks   Status Achieved          SLP Long Term Goals - 10/11/16 0818      SLP LONG TERM GOAL #1   Title Pt will alternate attention between 2 mildly complex cognitive linguistic tasks with 85% on each and occasional min A   Time 6   Period Weeks   Status On-going     SLP LONG TERM GOAL #2   Title Pt will complete Level III (abstract) naming tasks with 90% accuracy without cues   Time 6   Period Weeks   Status On-going     SLP LONG TERM GOAL #3   Title Pt will report daily functional and effective use of compensatory strategies for recall of daily activities and appointments.     Time 6   Period Weeks          Plan - 10/11/16 1144    Clinical Impression Statement Patient demonstrates good carryover of [place] of planner and daily to do list today. She reports functional use of external aids, strategies to improve recall and organization of scheduled activities. 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 She will benefit from continued instruction in compensations for anomia, strategies to improve attention. She is making excellent progress toward goals and is reporting functional gains at home. Anticipate discharge in next 2 weeks. Recommend skilled ST in order to maximize cognitive and communicative function and improve quality of life.   Speech Therapy Frequency 2x / week   Treatment/Interventions Language facilitation;SLP instruction and feedback;Compensatory techniques;Cognitive reorganization;Functional tasks;Internal/external aids;Patient/family education;Multimodal communcation approach;Environmental controls   Potential to Achieve Goals Good   SLP Home Exercise Plan begin routine for reviewing planner at the end of the day, deductive reasoning puzzle   Consulted and Agree with Plan of Care Patient      Patient will benefit from skilled therapeutic intervention in order to improve the following deficits and impairments:   Cognitive communication deficit    Problem List There are no active problems to display for this patient.  Olivia Harrison, [place], CCC-SLP Speech-Language Pathologist  Arlana Lindau 10/11/2016, 11:47 AM  [facility] [address] [place], [place], [address] Phone: [phone]   Fax:  [phone]   Name: Olivia Harrison MRN: [id number] Date of Birth: 1980-05-13

## 2016-10-14 ENCOUNTER — Ambulatory Visit: Payer: 59 | Admitting: Speech Pathology

## 2016-10-14 DIAGNOSIS — Z630 Problems in relationship with spouse or partner: Secondary | ICD-10-CM | POA: Diagnosis not present

## 2016-10-14 DIAGNOSIS — R41841 Cognitive communication deficit: Secondary | ICD-10-CM

## 2016-10-14 NOTE — Therapy (Signed)
[facility] [address] [place], [place], [address] Phone: [phone]   Fax:  [phone]  Speech Language Pathology Treatment  Patient Details  Name: Olivia Harrison MRN: [id number] Date of Birth: 03-01-1981 Referring Provider: Lubertha SouthLee Thomas Shuping, MD  Encounter Date: 10/14/2016      End of Session - 10/14/16 1122    Visit Number 5   Number of Visits 17   Date for SLP Re-Evaluation 11/19/16   Authorization Type no auth req   Authorization Time Period 60 visits comb   SLP Start Time 0810   SLP Stop Time  0847   SLP Time Calculation (min) 37 min   Activity Tolerance Patient tolerated treatment well      Past Medical History:  Diagnosis Date  . High cholesterol     Past Surgical History:  Procedure Laterality Date  . CESAREAN SECTION  2009   x1  . HYSTEROSCOPY  08/2009   polyps  . INDUCED ABORTION    . TUBAL LIGATION  2009  . VAGINAL DELIVERY     x3    There were no vitals filed for this visit.      Subjective Assessment - 10/14/16 0813    Subjective "This was really hard last night. I woke up Olivia to finish this morning." re: homework   Currently in Pain? Yes   Pain Location Neck   Pain Descriptors / Indicators Sore;Tightness   Pain Type Acute pain   Pain Onset In the past 7 days   Pain Frequency Occasional               ADULT SLP TREATMENT - 10/14/16 0815      General Information   Behavior/Cognition Alert;Cooperative   Patient Positioning Upright in chair   Oral care provided N/A     Treatment Provided   Treatment provided Cognitive-Linquistic     Pain Assessment   Pain Assessment No/denies pain     Cognitive-Linquistic Treatment   Treatment focused on Cognition   Skilled Treatment Pt described her difficulty completing homework when she was tired; SLP facilitated awareness of strategies she used to improve her focus and concentration. Level 2 naming with 95% accuracy.  SLP facilitated 20 minutes moderately complex conversation; pt initially required rare min A questioning cues. SLP re-educated in strategies for anomia, faded support and pt able to continue conversation using description and gestures for self-cuing.     Assessment / Recommendations / Plan   Plan Continue with current plan of care     Progression Toward Goals   Progression toward goals Progressing toward goals          SLP Education - 10/14/16 1121    Education provided Yes   Education Details compensations for anomia, cognitive breaks, scheduling difficult tasks for times she is most focused   Person(s) Educated Patient   Methods Explanation;Verbal cues   Comprehension Verbalized understanding;Returned demonstration          SLP Short Term Goals - 10/14/16 1152      SLP SHORT TERM GOAL #1   Title Pt will attend to details in mildly complex cognitive linguistic tasks with 85% accuracy and occasional min A   Baseline 2   Period Weeks   Status Achieved     SLP SHORT TERM GOAL #2   Title Pt will complete various naming tasks of increasing complexity with 90% success over 2 sessions, given mod assist   Baseline 10/12/11, 10/14/13   Time 2  Period Weeks   Status Achieved          SLP Long Term Goals - 10/14/16 1153      SLP LONG TERM GOAL #1   Title Pt will alternate attention between 2 mildly complex cognitive linguistic tasks with 85% on each and occasional min A   Time 6   Period Weeks   Status Achieved     SLP LONG TERM GOAL #2   Title Pt will demo compensations for anomia in 20 minutes moderately complex conversation without cues.   Time 6   Period Weeks   Status Revised     SLP LONG TERM GOAL #3   Title Pt will report daily functional and effective use of compensatory strategies for recall of daily activities and appointments.    Status Achieved          Plan - 10/14/16 1150    Clinical Impression Statement Pt continues use of planner to manage daily  tasks, appointments. Moderately complex wordfinding 95% accuracy with extended time. Anomia noted in 20 minutes conversation, with rare min A pt implements strategies functionally. She will benefit from continued instruction in compensations for anomia to improve carryover. She is making excellent progress toward goals and is reporting functional gains at home. Anticipate discharge next session. Recommend skilled ST in order to maximize cognitive and communicative function and improve quality of life.   Speech Therapy Frequency 2x / week   Treatment/Interventions Language facilitation;SLP instruction and feedback;Compensatory techniques;Cognitive reorganization;Functional tasks;Internal/external aids;Patient/family education;Multimodal communcation approach;Environmental controls   Potential to Achieve Goals Good   SLP Home Exercise Plan naming tasks   Consulted and Agree with Plan of Care Patient      Patient will benefit from skilled therapeutic intervention in order to improve the following deficits and impairments:   Cognitive communication deficit    Problem List There are no active problems to display for this patient.  Rondel Baton, [place], CCC-SLP Speech-Language Pathologist   Arlana Lindau 10/14/2016, 11:56 AM  [facility] [address] [place], [place], [address] Phone: [phone]   Fax:  [phone]   Name: Olivia Harrison MRN: [id number] Date of Birth: [birth year]

## 2016-10-19 ENCOUNTER — Ambulatory Visit: Payer: 59 | Admitting: Speech Pathology

## 2016-10-19 ENCOUNTER — Encounter: Payer: 59 | Admitting: Vascular Surgery

## 2016-10-19 DIAGNOSIS — R41841 Cognitive communication deficit: Secondary | ICD-10-CM

## 2016-10-19 NOTE — Therapy (Signed)
[facility] [address] [place], [place], [address] Phone: [phone]   Fax:  [phone]  Speech Language Pathology Treatment and Discharge Summary  Patient Details  Name: Olivia Harrison MRN: [id number] Date of Birth: Oct 12, 1980 Referring Provider: Juanda Bond, MD  Encounter Date: 10/19/2016      End of Session - 10/19/16 1730    Visit Number 6   Number of Visits 17   Date for SLP Re-Evaluation 11/19/16   Authorization Type no auth req   Authorization Time Period 60 visits comb   SLP Start Time 0850   SLP Stop Time  0933   SLP Time Calculation (min) 43 min   Activity Tolerance Patient tolerated treatment well      Past Medical History:  Diagnosis Date  . High cholesterol     Past Surgical History:  Procedure Laterality Date  . CESAREAN SECTION  2009   x1  . HYSTEROSCOPY  08/2009   polyps  . INDUCED ABORTION    . TUBAL LIGATION  2009  . VAGINAL DELIVERY     x3    There were no vitals filed for this visit.      Subjective Assessment - 10/19/16 0851    Subjective "I have to be consistent with what I've learned."   Currently in Pain? No/denies               ADULT SLP TREATMENT - 10/19/16 0859      General Information   Behavior/Cognition Alert;Cooperative;Pleasant mood   Patient Positioning Upright in chair   Oral care provided N/A     Treatment Provided   Treatment provided Cognitive-Linquistic     Pain Assessment   Pain Assessment No/denies pain     Cognitive-Linquistic Treatment   Treatment focused on Cognition   Skilled Treatment In 2 x 20 minutes moderately complex conversation, pt demo'd compensations for anomia including description, gestures, drawing independently. SLP provided education re: cognitive activities for home, maintenance of schedules/routines/compensatory aids.     Assessment / Recommendations / Plan   Plan Discharge SLP treatment due to (comment)      Progression Toward Goals   Progression toward goals Goals met, education completed, patient discharged from [facility] [place] Education - 10/19/16 0850    Education provided Yes   Education Details continue use of compensations and aids, cognitive activities for home   Person(s) Educated Patient   Methods Explanation   Comprehension Verbalized understanding          SLP Short Term Goals - 10/19/16 0904      SLP SHORT TERM GOAL #1   Title Pt will attend to details in mildly complex cognitive linguistic tasks with 85% accuracy and occasional min A   Period Weeks   Status Achieved     SLP SHORT TERM GOAL #2   Title Pt will complete various naming tasks of increasing complexity with 90% success over 2 sessions, given mod assist   Period Weeks   Status Achieved     SLP SHORT TERM GOAL #3   Title Pt will implement and use a system for functional recall of daily and scheduled activities with mod assistance over 2 sessions   Status Achieved          SLP Long Term Goals - 10/19/16 0904      SLP LONG TERM GOAL #1   Title Pt will alternate attention between 2 mildly complex cognitive  linguistic tasks with 85% on each and occasional min A   Status Achieved     SLP LONG TERM GOAL #2   Title Pt will demo compensations for anomia in 20 minutes moderately complex conversation without cues.   Time 5   Period Weeks   Status Achieved     SLP LONG TERM GOAL #3   Title Pt will report daily functional and effective use of compensatory strategies for recall of daily activities and appointments.    Status Achieved          Plan - 10/19/16 1733    Clinical Impression Statement Pt has met all short and long term goals. 2X 20 minutes conversations today with independent use of compensations for anomia. Reports functional gains in her verbal communication and memory. Education completed and cognitive activities for home provided. Discharge today due to meeting all goals. No  further skilled ST recommended at this time.   Speech Therapy Frequency --  discharge from therapy   Treatment/Interventions Language facilitation;SLP instruction and feedback;Compensatory techniques;Cognitive reorganization;Functional tasks;Internal/external aids;Patient/family education;Multimodal communcation approach;Environmental controls   Potential to [facility], use of planner and strategies   Consulted and Agree with Plan of Care Patient      Patient will benefit from skilled therapeutic intervention in order to improve the following deficits and impairments:   Cognitive communication deficit    Problem List There are no active problems to display for this patient.  SPEECH THERAPY DISCHARGE SUMMARY  Visits from Start of Care: 6  Current functional level related to goals / functional outcomes: Pt is able to recall or use external aids/memory strategies for complex information and planning complex future events most of the time.    Remaining deficits: Pt may have occasional breakdowns in the use of recall/memory strategies/external memory aids. These breakdowns may occasionally interfere with the individual's functioning in vocational, avocational, and social activities.    Education / Equipment: None recommended Plan: Patient agrees to discharge.  Patient goals were met. Patient is being discharged due to meeting the stated rehab goals.  ?????      Aliene Altes 10/19/2016, 5:38 PM  Deneise Lever, MS, [facility] [address] [place], [place], [address] Phone: [phone]   Fax:  [phone]   Name: Olivia Harrison MRN: [id number] Date of Birth: 1981/03/03

## 2016-10-28 DIAGNOSIS — Z630 Problems in relationship with spouse or partner: Secondary | ICD-10-CM | POA: Diagnosis not present

## 2016-11-11 ENCOUNTER — Encounter: Payer: 59 | Admitting: Speech Pathology

## 2016-11-12 DIAGNOSIS — Z630 Problems in relationship with spouse or partner: Secondary | ICD-10-CM | POA: Diagnosis not present

## 2016-11-15 ENCOUNTER — Encounter: Payer: 59 | Admitting: Speech Pathology

## 2016-11-18 ENCOUNTER — Encounter: Payer: 59 | Admitting: Speech Pathology

## 2016-11-22 ENCOUNTER — Encounter: Payer: 59 | Admitting: Speech Pathology

## 2016-11-25 ENCOUNTER — Encounter: Payer: 59 | Admitting: Speech Pathology

## 2016-11-25 DIAGNOSIS — Z630 Problems in relationship with spouse or partner: Secondary | ICD-10-CM | POA: Diagnosis not present

## 2016-11-29 ENCOUNTER — Encounter: Payer: 59 | Admitting: Speech Pathology

## 2016-12-09 DIAGNOSIS — Z630 Problems in relationship with spouse or partner: Secondary | ICD-10-CM | POA: Diagnosis not present

## 2017-01-28 DIAGNOSIS — Z630 Problems in relationship with spouse or partner: Secondary | ICD-10-CM | POA: Diagnosis not present

## 2017-02-25 DIAGNOSIS — Z630 Problems in relationship with spouse or partner: Secondary | ICD-10-CM | POA: Diagnosis not present

## 2017-03-18 DIAGNOSIS — M542 Cervicalgia: Secondary | ICD-10-CM | POA: Insufficient documentation

## 2017-03-29 ENCOUNTER — Other Ambulatory Visit (INDEPENDENT_AMBULATORY_CARE_PROVIDER_SITE_OTHER): Payer: 59

## 2017-03-29 ENCOUNTER — Telehealth: Payer: Self-pay | Admitting: Obstetrics & Gynecology

## 2017-03-29 DIAGNOSIS — E7800 Pure hypercholesterolemia, unspecified: Secondary | ICD-10-CM | POA: Diagnosis not present

## 2017-03-29 DIAGNOSIS — R7989 Other specified abnormal findings of blood chemistry: Secondary | ICD-10-CM

## 2017-03-29 NOTE — Telephone Encounter (Signed)
Patient called back and scheduled her aex  06/23/17 with Dr.Jertson. Patient would like to labs done before this aex appointment.

## 2017-03-29 NOTE — Telephone Encounter (Signed)
It looks like only the vit d and lipid panel were done. Was she supposed to have the other lab work done as well?

## 2017-03-29 NOTE — Telephone Encounter (Signed)
Spoke with patient. Patient would like to be seen to have her lipid panel and vitamin D checked. Has been feeling fatigued and thinks it may be due to not taking her vitamin D. Reports she has a history of high cholesterol and wants a baseline after dieting and exercising. Appointment scheduled for today 03/29/2017 at 9:45 am. Patient is fasting. Has an aex on 06/23/2017 and would like vitamin D, lipid panel, CBC, CMP, TSH, and A1c checked before that appointment to have results to review with Dr.Jertson at aex. Appointment scheduled for 06/17/2017 at 8:30 am. Aware these will need to be fasting as well.  Routing to provider for final review. Patient agreeable to disposition. Will close encounter.

## 2017-03-29 NOTE — Telephone Encounter (Signed)
Patient is a prior patient of Ria Commentatricia Grubb, FNP. She called this morning to schedule her AEX for after 06/15/17 but declined to schedule until checking out our website before selecting a new provider at our office. She is requesting to schedule lab tests for vitamin D and cholesterol soon but wants to speak with the nurse prior to scheduling.

## 2017-03-30 LAB — LIPID PANEL
CHOLESTEROL TOTAL: 353 mg/dL — AB (ref 100–199)
Chol/HDL Ratio: 3.2 ratio (ref 0.0–4.4)
HDL: 111 mg/dL (ref >39)
LDL CALC: 232 mg/dL — AB (ref 0–99)
Triglycerides: 48 mg/dL (ref 0–149)
VLDL CHOLESTEROL CAL: 10 mg/dL (ref 5–40)

## 2017-03-30 LAB — VITAMIN D 25 HYDROXY (VIT D DEFICIENCY, FRACTURES): VIT D 25 HYDROXY: 28.6 ng/mL — AB (ref 30.0–100.0)

## 2017-03-30 NOTE — Telephone Encounter (Signed)
Patient wanted to only have vitamin d and lipid panel done at this time. Wants to have vitamin D, lipid panel, CBC, CMP, TSH, and A1c rechecked right before her aex which is in 3 months so that she has a comparison and can review with you at her appointment.

## 2017-04-04 DIAGNOSIS — M545 Low back pain: Secondary | ICD-10-CM | POA: Diagnosis not present

## 2017-04-04 DIAGNOSIS — M542 Cervicalgia: Secondary | ICD-10-CM | POA: Diagnosis not present

## 2017-04-07 DIAGNOSIS — Z630 Problems in relationship with spouse or partner: Secondary | ICD-10-CM | POA: Diagnosis not present

## 2017-04-12 DIAGNOSIS — M545 Low back pain: Secondary | ICD-10-CM | POA: Diagnosis not present

## 2017-04-12 DIAGNOSIS — M542 Cervicalgia: Secondary | ICD-10-CM | POA: Diagnosis not present

## 2017-05-09 DIAGNOSIS — M542 Cervicalgia: Secondary | ICD-10-CM | POA: Diagnosis not present

## 2017-05-09 DIAGNOSIS — M545 Low back pain: Secondary | ICD-10-CM | POA: Diagnosis not present

## 2017-05-11 DIAGNOSIS — I82412 Acute embolism and thrombosis of left femoral vein: Secondary | ICD-10-CM | POA: Diagnosis not present

## 2017-05-11 DIAGNOSIS — I824Z1 Acute embolism and thrombosis of unspecified deep veins of right distal lower extremity: Secondary | ICD-10-CM | POA: Diagnosis not present

## 2017-05-11 DIAGNOSIS — I82409 Acute embolism and thrombosis of unspecified deep veins of unspecified lower extremity: Secondary | ICD-10-CM

## 2017-05-11 DIAGNOSIS — I82411 Acute embolism and thrombosis of right femoral vein: Secondary | ICD-10-CM | POA: Diagnosis not present

## 2017-05-11 HISTORY — DX: Acute embolism and thrombosis of unspecified deep veins of unspecified lower extremity: I82.409

## 2017-05-12 DIAGNOSIS — I82411 Acute embolism and thrombosis of right femoral vein: Secondary | ICD-10-CM | POA: Diagnosis not present

## 2017-05-12 DIAGNOSIS — I824Z1 Acute embolism and thrombosis of unspecified deep veins of right distal lower extremity: Secondary | ICD-10-CM | POA: Diagnosis not present

## 2017-05-12 DIAGNOSIS — M79604 Pain in right leg: Secondary | ICD-10-CM | POA: Diagnosis not present

## 2017-05-16 ENCOUNTER — Ambulatory Visit: Payer: 59 | Admitting: Vascular Surgery

## 2017-05-16 ENCOUNTER — Other Ambulatory Visit: Payer: Self-pay | Admitting: *Deleted

## 2017-05-16 ENCOUNTER — Other Ambulatory Visit: Payer: Self-pay

## 2017-05-16 ENCOUNTER — Ambulatory Visit (HOSPITAL_COMMUNITY)
Admission: RE | Admit: 2017-05-16 | Discharge: 2017-05-16 | Disposition: A | Discharge status: Home / Self Care | Payer: 59 | Source: Ambulatory Visit | Attending: Vascular Surgery | Admitting: Vascular Surgery

## 2017-05-16 VITALS — BP 122/77 | HR 88 | Temp 99.0°F | Resp 16 | Ht 66.0 in | Wt 160.0 lb

## 2017-05-16 DIAGNOSIS — I82890 Acute embolism and thrombosis of other specified veins: Secondary | ICD-10-CM | POA: Diagnosis not present

## 2017-05-16 DIAGNOSIS — M7989 Other specified soft tissue disorders: Principal | ICD-10-CM

## 2017-05-16 DIAGNOSIS — I82411 Acute embolism and thrombosis of right femoral vein: Secondary | ICD-10-CM | POA: Diagnosis not present

## 2017-05-16 DIAGNOSIS — M79661 Pain in right lower leg: Secondary | ICD-10-CM | POA: Insufficient documentation

## 2017-05-16 DIAGNOSIS — I82431 Acute embolism and thrombosis of right popliteal vein: Secondary | ICD-10-CM | POA: Insufficient documentation

## 2017-05-16 DIAGNOSIS — I83892 Varicose veins of left lower extremities with other complications: Secondary | ICD-10-CM | POA: Insufficient documentation

## 2017-05-16 NOTE — Progress Notes (Signed)
Subjective:     Patient ID: Olivia Harrison, female   DOB: 1980-06-14, 37 y.o.   MRN: [id number]  HPI This 37 year old female was referred by Dr.Ramachandran for evaluation of DVT in the right leg. This patient is a young healthy patient who noticed some cramping discomfort in the right calf prior to Christmas a few weeks ago. She then [place] to [place] and noticed when she returned that there was some swelling in the right leg greater than the left leg primarily in the calf. She continued have some cramping discomfort. She was seen at [facility] days ago and was told that she had a deep vein thrombosis behind the knee. She did not bring a copy of the report. She has a history of superficial "clot" in the left foot after twisting her ankle about a year or so ago but has no other history of thrombotic episode such as DVT or pulmonary embolus. She does not take aspirin. The patient says she is adamantly opposed to taking blood thinners although that was recommended at [facility]. She has had no previous adverse effects from blood thinners but was just take something more "natural". She currently is taking Tumeric extract 475 mg 2 tablets a day andNatto-K 2 times daily.  Past Medical History:  Diagnosis Date  . High cholesterol     Social History   Tobacco Use  . Smoking status: Never Smoker  . Smokeless tobacco: Never Used  Substance Use Topics  . Alcohol use: No    Family History  Problem Relation Age of Onset  . Heart attack Mother 947  . Heart failure Father   . Hypertension Father   . Diabetes Brother   . Colon cancer Paternal Aunt 6552       colon cancer stage IV, died within a few days  . Hyperlipidemia Sister   . Heart failure Sister   . Hypertension Sister   . Breast cancer Maternal Aunt 35       mastectomy  . Breast cancer Paternal Aunt 50       died within 1 yr ?  Marland Kitchen. Hyperlipidemia Brother   . Hypertension Paternal Grandmother     Allergies  Allergen Reactions   . Sulfa Antibiotics Rash  . Sulfasalazine Rash     Current Outpatient Medications:  Marland Kitchen.  Vitamin D, Ergocalciferol, (DRISDOL) 50000 units CAPS capsule, TAKE 1 CAPSULE BY MOUTH EVERY 7 DAYS, Disp: 30 capsule, Rfl: 3 .  cyclobenzaprine (FLEXERIL) 10 MG tablet, Take 1 tablet (10 mg total) by mouth 3 (three) times daily as needed for muscle spasms. (Patient not taking: Reported on 09/21/2016), Disp: 30 tablet, Rfl: 0 .  meloxicam (MOBIC) 15 MG tablet, Take 1 tablet (15 mg total) by mouth daily. (Patient not taking: Reported on 09/21/2016), Disp: 30 tablet, Rfl: 1  Vitals:   05/16/17 1411  BP: 122/77  Pulse: 88  Resp: 16  Temp: 99 F (37.2 C)  TempSrc: Oral  Weight: 160 lb (72.6 kg)  Height: 5\' 6"  (1.676 m)    Body mass index is 25.82 kg/m.         Review of Systems Denies chest pain, dyspnea on exertion, PND, orthopnea, hemoptysis. Negative review of systems    Objective:   Physical Exam BP 122/77 (BP Location: Left Arm, Patient Position: Sitting, Cuff Size: Normal)   Pulse 88   Temp 99 F (37.2 C) (Oral)   Resp 16   Ht 5\' 6"  (1.676 m)   Wt 160  lb (72.6 kg)   BMI 25.82 kg/m     Gen.-alert and oriented x3 in no apparent distress HEENT normal for age Lungs no rhonchi or wheezing Cardiovascular regular rhythm no murmurs carotid pulses 3+ palpable no bruits audible Abdomen soft nontender no palpable masses Musculoskeletal free of  major deformities Skin clear -no rashes Neurologic normal Lower extremities 3+ femoral and dorsalis pedis pulses palpable bilaterally with no edema on the left 1+ edema in the right calf Right calf 2-1/2 cm larger in circumference than the left. Distal thigh equal circumference bilaterally. A few spider veins in right lateral thigh but no bulging varicosities. No hyperpigmentation or ulceration noted.  Today I ordered a venous ultrasound to confirm the DVT which had been diagnosed at [facility] last week and to see if there has been any  resolution. 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 Patient previously evaluated at [facility] with similar diagnosis. Recommendation was for chronic anticoagulation for 2-3 months and repeat ultrasound. Patient chooses to remain on her current medications-Tumeric 475 mg twice a day andNatta-K 2 times daily. She will discuss this with her medical doctor and make a decision regarding chronic anticoagulation Patient was advised that not proceeding with anticoagulation would increase the risk of worsening of her DVT and possible pulmonary embolus and she is aware of this     Plan:     I recommendedXeralrto 15 mg twice a day for 21 days and then 20 mg daily with repeat ultrasound in 2-3 months. She will follow-up with her medical doctor and discussed this and make a decision regarding chronic anticoagulation Questions were answered. 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

## 2017-05-18 ENCOUNTER — Encounter: Payer: Self-pay | Admitting: Family Medicine

## 2017-05-23 DIAGNOSIS — M545 Low back pain: Secondary | ICD-10-CM | POA: Diagnosis not present

## 2017-05-23 DIAGNOSIS — M542 Cervicalgia: Secondary | ICD-10-CM | POA: Diagnosis not present

## 2017-05-26 DIAGNOSIS — I824Z1 Acute embolism and thrombosis of unspecified deep veins of right distal lower extremity: Secondary | ICD-10-CM | POA: Diagnosis not present

## 2017-05-26 DIAGNOSIS — I82411 Acute embolism and thrombosis of right femoral vein: Secondary | ICD-10-CM | POA: Diagnosis not present

## 2017-05-30 DIAGNOSIS — M542 Cervicalgia: Secondary | ICD-10-CM | POA: Diagnosis not present

## 2017-05-30 DIAGNOSIS — M545 Low back pain: Secondary | ICD-10-CM | POA: Diagnosis not present

## 2017-05-31 DIAGNOSIS — I824Z1 Acute embolism and thrombosis of unspecified deep veins of right distal lower extremity: Secondary | ICD-10-CM | POA: Diagnosis not present

## 2017-05-31 DIAGNOSIS — I82411 Acute embolism and thrombosis of right femoral vein: Secondary | ICD-10-CM | POA: Diagnosis not present

## 2017-05-31 DIAGNOSIS — M79604 Pain in right leg: Secondary | ICD-10-CM | POA: Diagnosis not present

## 2017-06-03 DIAGNOSIS — I2699 Other pulmonary embolism without acute cor pulmonale: Secondary | ICD-10-CM

## 2017-06-03 HISTORY — DX: Other pulmonary embolism without acute cor pulmonale: I26.99

## 2017-06-06 DIAGNOSIS — M545 Low back pain: Secondary | ICD-10-CM | POA: Diagnosis not present

## 2017-06-06 DIAGNOSIS — M542 Cervicalgia: Secondary | ICD-10-CM | POA: Diagnosis not present

## 2017-06-17 ENCOUNTER — Other Ambulatory Visit (INDEPENDENT_AMBULATORY_CARE_PROVIDER_SITE_OTHER): Payer: 59

## 2017-06-17 ENCOUNTER — Other Ambulatory Visit: Payer: Self-pay

## 2017-06-17 DIAGNOSIS — Z0000 Encounter for general adult medical examination without abnormal findings: Secondary | ICD-10-CM

## 2017-06-17 DIAGNOSIS — M545 Low back pain: Secondary | ICD-10-CM | POA: Diagnosis not present

## 2017-06-17 DIAGNOSIS — M542 Cervicalgia: Secondary | ICD-10-CM | POA: Diagnosis not present

## 2017-06-18 LAB — LIPID PANEL
CHOL/HDL RATIO: 3 ratio (ref 0.0–4.4)
Cholesterol, Total: 274 mg/dL — ABNORMAL HIGH (ref 100–199)
HDL: 90 mg/dL (ref >39)
LDL CALC: 176 mg/dL — AB (ref 0–99)
Triglycerides: 42 mg/dL (ref 0–149)
VLDL Cholesterol Cal: 8 mg/dL (ref 5–40)

## 2017-06-18 LAB — COMPREHENSIVE METABOLIC PANEL
A/G RATIO: 1.7 (ref 1.2–2.2)
ALBUMIN: 4.3 g/dL (ref 3.5–5.5)
ALT: 9 IU/L (ref 0–32)
AST: 13 IU/L (ref 0–40)
Alkaline Phosphatase: 45 IU/L (ref 39–117)
BUN / CREAT RATIO: 25 — AB (ref 9–23)
BUN: 20 mg/dL (ref 6–20)
Bilirubin Total: 0.5 mg/dL (ref 0.0–1.2)
CO2: 22 mmol/L (ref 20–29)
CREATININE: 0.8 mg/dL (ref 0.57–1.00)
Calcium: 9.1 mg/dL (ref 8.7–10.2)
Chloride: 100 mmol/L (ref 96–106)
GFR, EST AFRICAN AMERICAN: 108 mL/min/{1.73_m2} (ref >59)
GFR, EST NON AFRICAN AMERICAN: 94 mL/min/{1.73_m2} (ref >59)
GLOBULIN, TOTAL: 2.6 g/dL (ref 1.5–4.5)
Glucose: 81 mg/dL (ref 65–99)
POTASSIUM: 4.6 mmol/L (ref 3.5–5.2)
SODIUM: 138 mmol/L (ref 134–144)
Total Protein: 6.9 g/dL (ref 6.0–8.5)

## 2017-06-18 LAB — VITAMIN D 25 HYDROXY (VIT D DEFICIENCY, FRACTURES): VIT D 25 HYDROXY: 21.7 ng/mL — AB (ref 30.0–100.0)

## 2017-06-18 LAB — CBC
HEMATOCRIT: 38.9 % (ref 34.0–46.6)
HEMOGLOBIN: 12.6 g/dL (ref 11.1–15.9)
MCH: 30.1 pg (ref 26.6–33.0)
MCHC: 32.4 g/dL (ref 31.5–35.7)
MCV: 93 fL (ref 79–97)
Platelets: 159 10*3/uL (ref 150–379)
RBC: 4.18 x10E6/uL (ref 3.77–5.28)
RDW: 14.1 % (ref 12.3–15.4)
WBC: 4.8 10*3/uL (ref 3.4–10.8)

## 2017-06-18 LAB — HEMOGLOBIN A1C
ESTIMATED AVERAGE GLUCOSE: 91 mg/dL
Hgb A1c MFr Bld: 4.8 % (ref 4.8–5.6)

## 2017-06-18 LAB — TSH: TSH: 2.1 u[IU]/mL (ref 0.450–4.500)

## 2017-06-20 ENCOUNTER — Encounter: Payer: Self-pay | Admitting: Family Medicine

## 2017-06-20 ENCOUNTER — Other Ambulatory Visit: Payer: Self-pay

## 2017-06-20 ENCOUNTER — Telehealth: Payer: Self-pay | Admitting: Obstetrics and Gynecology

## 2017-06-20 ENCOUNTER — Ambulatory Visit: Payer: Self-pay | Admitting: *Deleted

## 2017-06-20 ENCOUNTER — Ambulatory Visit: Payer: 59 | Admitting: Family Medicine

## 2017-06-20 VITALS — BP 102/60 | HR 61 | Temp 98.1°F | Resp 18 | Ht 66.0 in | Wt 159.4 lb

## 2017-06-20 DIAGNOSIS — M722 Plantar fascial fibromatosis: Secondary | ICD-10-CM

## 2017-06-20 DIAGNOSIS — I809 Phlebitis and thrombophlebitis of unspecified site: Secondary | ICD-10-CM | POA: Diagnosis not present

## 2017-06-20 DIAGNOSIS — I824Z1 Acute embolism and thrombosis of unspecified deep veins of right distal lower extremity: Secondary | ICD-10-CM

## 2017-06-20 DIAGNOSIS — M79672 Pain in left foot: Secondary | ICD-10-CM

## 2017-06-20 NOTE — Telephone Encounter (Signed)
Pt called because she feels like she has a blood clot in the top of her left foot. She has a history of blood clots and plantar fascitis. She is having more symptoms that pertain to a blood clot. Pain at the bottom of her foot, ankle swelling, left side of that foot swelling and a knot about the side of a dime on top of foot. She has been taking a natural blood thinner, called Nattokinase blend for her hx of blood clots. She is requesting to be worked up for a possible blood cot. Appointment made today per protocol. Home care advice given to patient with verbal understanding.  Reason for Disposition . [1] Swollen foot AND [2] no fever  (Exceptions: localized bump from bunions, calluses, insect bite, sting)  Answer Assessment - Initial Assessment Questions 1. ONSET: "When did the pain start?"      Wednesday top of foot, Friday the bottom of foot 2. LOCATION: "Where is the pain located?"      Left foot 3. PAIN: "How bad is the pain?"    (Scale 1-10; or mild, moderate, severe)   -  MILD (1-3): doesn't interfere with normal activities    -  MODERATE (4-7): interferes with normal activities (e.g., work or school) or awakens from sleep, limping    -  SEVERE (8-10): excruciating pain, unable to do any normal activities, unable to walk     Standing 8-9 4. WORK OR EXERCISE: "Has there been any recent work or exercise that involved this part of the body?"      No but she is a Marketing executivefitness instructor.  5. CAUSE: "What do you think is causing the foot pain?"     Blood clot? 6. OTHER SYMPTOMS: "Do you have any other symptoms?" (e.g., leg pain, rash, fever, numbness)     Swelling left foot, ankle pain 7. PREGNANCY: "Is there any chance you are pregnant?" "When was your last menstrual period?"     No,  tubal ligation, no sure of LMP  Protocols used: FOOT PAIN-A-AH
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
## 2017-06-20 NOTE — Telephone Encounter (Signed)
Patient would like to know what labs she had done on Friday and would like additional blood work for clotting issues.

## 2017-06-20 NOTE — Telephone Encounter (Signed)
Spoke with patient. Advised patient that on 06/17/2017 she had a lipid panel, CMP, CBC, vitamin D, TSH, and HgA1c. Patient has an appointment today with her PCP due to pain in her left foot. Has a history of DVT. Advised PCP will be able to assess and order any additional lab work that may be needed. Patient verbalizes understanding.  Routing to provider for final review. Patient agreeable to disposition. Will close encounter.

## 2017-06-21 ENCOUNTER — Encounter: Payer: Self-pay | Admitting: *Deleted

## 2017-06-22 ENCOUNTER — Encounter: Payer: Self-pay | Admitting: Family Medicine

## 2017-06-23 ENCOUNTER — Emergency Department (HOSPITAL_COMMUNITY): Payer: 59

## 2017-06-23 ENCOUNTER — Encounter (HOSPITAL_COMMUNITY): Payer: Self-pay | Admitting: Emergency Medicine

## 2017-06-23 ENCOUNTER — Other Ambulatory Visit: Payer: Self-pay

## 2017-06-23 ENCOUNTER — Ambulatory Visit: Payer: 59 | Admitting: Obstetrics and Gynecology

## 2017-06-23 ENCOUNTER — Observation Stay (HOSPITAL_COMMUNITY)
Admission: EM | Admit: 2017-06-23 | Discharge: 2017-06-24 | Disposition: A | Discharge status: Home / Self Care | Payer: 59 | Attending: Internal Medicine | Admitting: Internal Medicine

## 2017-06-23 DIAGNOSIS — Z86718 Personal history of other venous thrombosis and embolism: Secondary | ICD-10-CM

## 2017-06-23 DIAGNOSIS — R21 Rash and other nonspecific skin eruption: Secondary | ICD-10-CM

## 2017-06-23 DIAGNOSIS — R079 Chest pain, unspecified: Secondary | ICD-10-CM | POA: Diagnosis not present

## 2017-06-23 DIAGNOSIS — Z79899 Other long term (current) drug therapy: Secondary | ICD-10-CM | POA: Diagnosis not present

## 2017-06-23 DIAGNOSIS — I2699 Other pulmonary embolism without acute cor pulmonale: Principal | ICD-10-CM

## 2017-06-23 DIAGNOSIS — Z7982 Long term (current) use of aspirin: Secondary | ICD-10-CM | POA: Diagnosis not present

## 2017-06-23 DIAGNOSIS — I82431 Acute embolism and thrombosis of right popliteal vein: Secondary | ICD-10-CM | POA: Diagnosis not present

## 2017-06-23 DIAGNOSIS — G8921 Chronic pain due to trauma: Secondary | ICD-10-CM

## 2017-06-23 DIAGNOSIS — M549 Dorsalgia, unspecified: Secondary | ICD-10-CM | POA: Diagnosis not present

## 2017-06-23 DIAGNOSIS — Z882 Allergy status to sulfonamides status: Secondary | ICD-10-CM

## 2017-06-23 DIAGNOSIS — M542 Cervicalgia: Secondary | ICD-10-CM

## 2017-06-23 DIAGNOSIS — Z8249 Family history of ischemic heart disease and other diseases of the circulatory system: Secondary | ICD-10-CM

## 2017-06-23 DIAGNOSIS — Z7901 Long term (current) use of anticoagulants: Secondary | ICD-10-CM

## 2017-06-23 DIAGNOSIS — I82402 Acute embolism and thrombosis of unspecified deep veins of left lower extremity: Secondary | ICD-10-CM

## 2017-06-23 DIAGNOSIS — I8390 Asymptomatic varicose veins of unspecified lower extremity: Secondary | ICD-10-CM

## 2017-06-23 HISTORY — DX: Other pulmonary embolism without acute cor pulmonale: I26.99

## 2017-06-23 HISTORY — DX: Other chronic pain: G89.29

## 2017-06-23 HISTORY — DX: Cervicalgia: M54.2

## 2017-06-23 LAB — BASIC METABOLIC PANEL
Anion gap: 12 (ref 5–15)
BUN: 11 mg/dL (ref 6–20)
CHLORIDE: 101 mmol/L (ref 101–111)
CO2: 23 mmol/L (ref 22–32)
CREATININE: 0.85 mg/dL (ref 0.44–1.00)
Calcium: 9.1 mg/dL (ref 8.9–10.3)
GFR calc Af Amer: >60 mL/min (ref >60)
GLUCOSE: 85 mg/dL (ref 65–99)
POTASSIUM: 3.9 mmol/L (ref 3.5–5.1)
Sodium: 136 mmol/L (ref 135–145)

## 2017-06-23 LAB — CBC
HEMATOCRIT: 39.4 % (ref 36.0–46.0)
Hemoglobin: 13.2 g/dL (ref 12.0–15.0)
MCH: 30.5 pg (ref 26.0–34.0)
MCHC: 33.5 g/dL (ref 30.0–36.0)
MCV: 91 fL (ref 78.0–100.0)
Platelets: 171 10*3/uL (ref 150–400)
RBC: 4.33 MIL/uL (ref 3.87–5.11)
RDW: 13.2 % (ref 11.5–15.5)
WBC: 6.2 10*3/uL (ref 4.0–10.5)

## 2017-06-23 LAB — I-STAT TROPONIN, ED: Troponin i, poc: 0 ng/mL (ref 0.00–0.08)

## 2017-06-23 LAB — I-STAT BETA HCG BLOOD, ED (MC, WL, AP ONLY): I-stat hCG, quantitative: <5 m[IU]/mL (ref <5)

## 2017-06-23 MED ORDER — ACETAMINOPHEN 325 MG PO TABS
650.0000 mg | ORAL_TABLET | Freq: Four times a day (QID) | ORAL | Status: DC | PRN
  Administered 2017-06-23 (×2): 650 mg via ORAL
  Filled 2017-06-23 (×2): qty 2

## 2017-06-23 MED ORDER — RIVAROXABAN 20 MG PO TABS: 20.0000 mg | ORAL_TABLET | Freq: Every day | ORAL | 1 refills | Status: DC

## 2017-06-23 MED ORDER — KETOROLAC TROMETHAMINE 15 MG/ML IJ SOLN: 15.0000 mg | Freq: Four times a day (QID) | INTRAMUSCULAR | Status: DC | PRN

## 2017-06-23 MED ORDER — RIVAROXABAN 15 MG PO TABS
15.0000 mg | ORAL_TABLET | Freq: Two times a day (BID) | ORAL | Status: DC
  Administered 2017-06-23 – 2017-06-24 (×3): 15 mg via ORAL
  Filled 2017-06-23 (×3): qty 1

## 2017-06-23 MED ORDER — RIVAROXABAN 15 MG PO TABS: 15.0000 mg | ORAL_TABLET | Freq: Two times a day (BID) | ORAL | 0 refills | Status: DC

## 2017-06-23 MED ORDER — IOPAMIDOL (ISOVUE-370) INJECTION 76%
INTRAVENOUS | Status: AC
  Administered 2017-06-23: 65 mL via INTRAVENOUS
  Filled 2017-06-23: qty 100

## 2017-06-23 MED ORDER — ENOXAPARIN SODIUM 80 MG/0.8ML ~~LOC~~ SOLN
1.0000 mg/kg | Freq: Once | SUBCUTANEOUS | Status: DC
  Filled 2017-06-23: qty 0.8

## 2017-06-23 MED ORDER — RIVAROXABAN 20 MG PO TABS: 20.0000 mg | ORAL_TABLET | Freq: Every day | ORAL | Status: DC

## 2017-06-23 NOTE — ED Provider Notes (Signed)
Received call from radiologist, Dr. Allena KatzPatel.  Patient with right lower lobe PE.  No evidence of heart strain. 8:09 AM Patient signed out to me by Dr. Bebe ShaggyWickline.  She has a history of DVT and is not on anticoagulation due to her wishes.  She presents today with back and chest pain.  CTA had been ordered to evaluate for pulmonary embolism.  Plan discussion with patient regarding results and need for anticoagulation  Discussed with patient and husband with extensive conversation regarding treatment.  Patient consents to anticoagulation. Plan pharmacy consult  Discussed with Dr. Dimple Caseyice, IM teaching service and will be in to evaluate. Discussed with pharmacy    Margarita Grizzleay, Loi Rennaker, MD 06/24/17 [phone]

## 2017-06-23 NOTE — Discharge Planning (Signed)
Benefit Check results:  1. XARELTO  15 MG BID  COVER- YES  CO-PAY- $ 35.00 Q/L ONE PILL PER DAYS  TIER- 2 DRUG  PRIOR APPROVAL- YES # [phone]   2. XARELTO 20 MG DAILY  COVER- YES  CO-PAY- $ 35.00  Q/ ONE PILL PER DAY  TIER- 2 DRUG  PRIOR APPROVAL- NO    3. ELIQUIS 2.5 MG BID  COVER- YES  CO-PAY- $ 50.00  Q/ L TWO PILL PER DAY  TIER- 3 DRUG  PRIOR APPROVAL- NO   4. ELIQUIS 5 MG BID  COVER- YES  CO-PAY- $ 50.00  Q/L TWO 1/2 PILL PER DAY  TIER- 3 DRUG  PRIOR APPROVAL- NO   PREFERRED PHARMACY : [facility]
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
## 2017-06-23 NOTE — ED Notes (Signed)
Attempted to call report

## 2017-06-23 NOTE — ED Notes (Signed)
Pt states pain 3/10 at back but declines any meds.

## 2017-06-23 NOTE — ED Provider Notes (Signed)
[facility] Provider Note   CSN: [id number] Arrival date & time: 06/23/17  0450     History   Chief Complaint Chief Complaint  Patient presents with  . Back Pain  . Chest Pain    HPI Olivia Harrison is a 37 y.o. female.  The history is provided by the patient.  Back Pain   This is a new problem. Episode onset: Several hours ago. The problem occurs constantly. The problem has not changed since onset.The pain is associated with no known injury. Pain location: Right scapular region. The quality of the pain is described as cramping. The pain is moderate. Exacerbated by: Breathing. Associated symptoms include chest pain. Pertinent negatives include no fever and no abdominal pain. She has tried nothing for the symptoms.  Chest Pain   Associated symptoms include back pain. Pertinent negatives include no abdominal pain and no fever.  Patient with known history of right lower extremity DVT, not on anticoagulation, presents with onset of upper back pain/right scapular pain as well as some mild chest pain.  She reports that her pain is worse with deep breathing.  She denies fever or vomiting.  There is no cough or hemoptysis.  Past Medical History:  Diagnosis Date  . DVT (deep venous thrombosis) (HCC) 05/11/2017   Right leg   . High cholesterol     Patient Active Problem List   Diagnosis Date Noted  . Varicose veins of left lower extremity with complications 05/16/2017  . Acute deep vein thrombosis (DVT) of popliteal vein of right lower extremity (HCC) 05/16/2017    Past Surgical History:  Procedure Laterality Date  . CESAREAN SECTION  2009   x1  . HYSTEROSCOPY  08/2009   polyps  . INDUCED ABORTION    . TUBAL LIGATION  2009  . VAGINAL DELIVERY     x3    OB History    Gravida Para Term Preterm AB Living   5 4 4  0 1 4   SAB TAB Ectopic Multiple Live Births   0 0 0 0 4       Home Medications    Prior to Admission medications     Medication Sig Start Date End Date Taking? Authorizing Provider  aspirin 81 MG chewable tablet Chew 324 mg by mouth once.   Yes [provider]  Capsicum, Cayenne, (CAYENNE PO) Take 500 mg by mouth daily.    Yes [provider]  Cholecalciferol (VITAMIN D3) 5000 units TABS Take 5,000 Units by mouth daily.   Yes [provider]  Garlic 100 MG TABS Take 100 mg by mouth daily.    Yes [provider]  NATTOKINASE PO Take 1 tablet by mouth 2 (two) times daily.    Yes [provider]  Turmeric 450 MG CAPS Take by mouth 2 (two) times daily.   Yes [provider]    Family History Family History  Problem Relation Age of Onset  . Heart attack Mother [id number]  . Heart failure Father   . Hypertension Father   . Diabetes Brother   . Colon cancer Paternal Aunt [id number]       colon cancer stage IV, died within a few days  . Hyperlipidemia Sister   . Heart failure Sister   . Hypertension Sister   . Breast cancer Maternal Aunt 35       mastectomy  . Breast cancer Paternal Aunt 50       died within 1 yr ?  .Marland Kitchen  Hyperlipidemia Brother   . Hypertension Paternal Grandmother     Social History Social History   Tobacco Use  . Smoking status: Never Smoker  . Smokeless tobacco: Never Used  Substance Use Topics  . Alcohol use: No  . Drug use: No     Allergies   Sulfa antibiotics and Sulfasalazine   Review of Systems Review of Systems  Constitutional: Negative for fever.  Cardiovascular: Positive for chest pain.  Gastrointestinal: Negative for abdominal pain.  Musculoskeletal: Positive for back pain.  Neurological: Negative for syncope.  All other systems reviewed and are negative.    Physical Exam Updated Vital Signs BP 128/64 (BP Location: Right Arm)   Pulse 74   Temp 98.6 F (37 C) (Oral)   Resp 16   Ht 1.676 m (5\' 6" )   Wt 68 kg (150 lb)   LMP 06/20/2017 Comment: currently on   SpO2 100%   BMI 24.21 kg/m   Physical  Exam  CONSTITUTIONAL: Well developed/well nourished HEAD: Normocephalic/atraumatic EYES: EOMI/PERRL ENMT: Mucous membranes moist NECK: supple no meningeal signs SPINE/BACK:entire spine nontender, no tenderness to back or scapular region CV: S1/S2 noted, no murmurs/rubs/gallops noted LUNGS: Lungs are clear to auscultation bilaterally, no apparent distress ABDOMEN: soft, nontender, no rebound or guarding, bowel sounds noted throughout abdomen GU:no cva tenderness NEURO: Pt is awake/alert/appropriate, moves all extremitiesx4.  No facial droop.   EXTREMITIES: pulses normal/equal, full ROM, mild bilateral calf tenderness, distal pulses intact, patient wearing compression stockings SKIN: warm, color normal PSYCH: no abnormalities of mood noted, alert and oriented to situation  ED Treatments / Results  Labs (all labs ordered are listed, but only abnormal results are displayed) Labs Reviewed  BASIC METABOLIC PANEL  CBC  I-STAT TROPONIN, ED  I-STAT BETA HCG BLOOD, ED ([facility], [facility], [facility] ONLY)    EKG  EKG Interpretation  Date/Time:  Thursday June 23 2017 04:57:31 EST Ventricular Rate:  84 PR Interval:  130 QRS Duration: 84 QT Interval:  394 QTC Calculation: 465 R Axis:   89 Text Interpretation:  Normal sinus rhythm with sinus arrhythmia Normal ECG Confirmed by Zadie Rhine ([id number]) on 06/23/2017 5:57:25 AM       Radiology Dg Chest 2 View  Result Date: 06/23/2017 CLINICAL DATA:  Chest and back pain. EXAM: CHEST  2 VIEW COMPARISON:  06/17/2012 FINDINGS: The cardiomediastinal contours are normal. The lungs are clear. Pulmonary vasculature is normal. No consolidation, pleural effusion, or pneumothorax. No acute osseous abnormalities are seen. IMPRESSION: No acute pulmonary process. Electronically Signed   By: Rubye Oaks M.D.   On: 06/23/2017 05:29    Procedures Procedures   Medications Ordered in ED Medications - No data to display   Initial Impression / Assessment and  Plan / ED Course  I have reviewed the triage vital signs and the nursing notes.  Pertinent labs & imaging results that were available during my care of the patient were reviewed by me and considered in my medical decision making (see chart for details).     6:49 AM Patient with known history of right lower extremity DVT, not on anticoagulation presents with back and chest pain that appears to be pleuritic.  She is in no distress.  No hypoxia.  However I am concerned for PE.  After long discussion, she agrees to have CT angios chest  7:38 AM Signed out to dr ray with CT imaging pending   Final Clinical Impressions(s) / ED Diagnoses   Final diagnoses:  None    ED  Discharge Orders    None       Zadie Rhine, MD 06/23/17 [phone]

## 2017-06-23 NOTE — ED Notes (Signed)
When explaining lovenox and why it was being administered pt stated no one told her she would be getting a shot and would like to speak to IM resident prior to administration.

## 2017-06-23 NOTE — ED Notes (Signed)
IM resident at bedside.

## 2017-06-23 NOTE — ED Triage Notes (Signed)
Pt reports onset of R sided CP yesterday evening with radiation to her back. Pt states she is not SOB but states "it feels weird to take a deep breath" Pt dx with DVT 05/08/17, currently on no blood thinners.

## 2017-06-23 NOTE — ED Notes (Signed)
ED Provider at bedside. 

## 2017-06-23 NOTE — ED Notes (Signed)
Patient transported to CT 

## 2017-06-23 NOTE — Discharge Instructions (Signed)
Information on my medicine - XARELTO (rivaroxaban)  This medication education was reviewed with me or my healthcare representative as part of my discharge preparation.  The pharmacist that spoke with me during my hospital stay was:  Fredrik RiggerMarkle, Aphrodite Harpenau Sue, [facility]  WHY WAS Carlena HurlXARELTO PRESCRIBED FOR YOU? Xarelto was prescribed to treat blood clots that may have been found in the veins of your legs (deep vein thrombosis) or in your lungs (pulmonary embolism) and to reduce the risk of them occurring again.  What do you need to know about Xarelto? The starting dose is one 15 mg tablet taken TWICE daily with food for the FIRST 21 DAYS then on 07/14/17  the dose is changed to one 20 mg tablet taken ONCE A DAY with your evening meal.  DO NOT stop taking Xarelto without talking to the health care provider who prescribed the medication.  Refill your prescription for 20 mg tablets before you run out.  After discharge, you should have regular check-up appointments with your healthcare provider that is prescribing your Xarelto.  In the future your dose may need to be changed if your kidney function changes by a significant amount.  What do you do if you miss a dose? If you are taking Xarelto TWICE DAILY and you miss a dose, take it as soon as you remember. You may take two 15 mg tablets (total 30 mg) at the same time then resume your regularly scheduled 15 mg twice daily the next day.  If you are taking Xarelto ONCE DAILY and you miss a dose, take it as soon as you remember on the same day then continue your regularly scheduled once daily regimen the next day. Do not take two doses of Xarelto at the same time.   Important Safety Information Xarelto is a blood thinner medicine that can cause bleeding. You should call your healthcare provider right away if you experience any of the following: ? Bleeding from an injury or your nose that does not stop. ? Unusual colored urine (red or dark brown) or unusual  colored stools (red or black). ? Unusual bruising for unknown reasons. ? A serious fall or if you hit your head (even if there is no bleeding).  Some medicines may interact with Xarelto and might increase your risk of bleeding while on Xarelto. To help avoid this, consult your healthcare provider or pharmacist prior to using any new prescription or non-prescription medications, including herbals, vitamins, non-steroidal anti-inflammatory drugs (NSAIDs) and supplements.  This website has more information on Xarelto: [email or web address].xarelto.com.

## 2017-06-23 NOTE — ED Notes (Addendum)
Vitals updated for transport. Pt is currently eating in bed. Family at bedside discussing whether or not Pt wants to stay in the hospital with us and requested this tech to hold off on routine testing. RN notified.

## 2017-06-23 NOTE — ED Notes (Signed)
Pt moved forward with the decision to stay. RN notified. Routine Bloodwork Testing to be collected at this time.
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
## 2017-06-23 NOTE — ED Notes (Signed)
Pt denies pain or discomfort. Family at bedsdie and will let me know in a few moments if she will stay.

## 2017-06-23 NOTE — ED Notes (Signed)
REGULAR diet tray ordered at 18:05; nutritional services instructed to bring meal tray to 6E-05.

## 2017-06-23 NOTE — ED Notes (Signed)
Pt to treatment room via wheelchair at this time, hooked up to EKG 5 lead, BP cuff and pulse ox. Awaiting MD eval.

## 2017-06-24 DIAGNOSIS — Z86718 Personal history of other venous thrombosis and embolism: Secondary | ICD-10-CM | POA: Diagnosis not present

## 2017-06-24 DIAGNOSIS — I2699 Other pulmonary embolism without acute cor pulmonale: Secondary | ICD-10-CM | POA: Diagnosis not present

## 2017-06-24 DIAGNOSIS — I82431 Acute embolism and thrombosis of right popliteal vein: Secondary | ICD-10-CM | POA: Diagnosis not present

## 2017-06-24 LAB — HIV ANTIBODY (ROUTINE TESTING W REFLEX): HIV SCREEN 4TH GENERATION: NONREACTIVE

## 2017-06-24 NOTE — Progress Notes (Signed)
   No acute events overnight, she feels her breathing is stable, still having some intermittent back pain with deep breaths but feels this has improved. She was counseled on a gradual return to her normal exercise regimen. She is stable for discharge    General: Resting in bed comfortably, no acute distress CV: RRR, no murmur appreciated Resp: Clear breath sounds bilaterally, normal work of breathing, no distress  Extr: Slight asymmetry in calf circumference and edema, stable from prior  Neuro: Alert and oriented x3  Skin: Warm, dry  --Cont Xarelto 15 mg BID  --Stable for discharge, see discharge summary for full assessment    Ginger CarneHarden, Ramil Edgington, MD 06/24/2017, 8:06 AM Pager: [phone]

## 2017-06-24 NOTE — Discharge Summary (Signed)
Name: Olivia Harrison MRN: [id number] DOB: Jun 19, 1980 37 y.o. PCP: Georgianne Fickamachandran, Ajith, MD  Date of Admission: 06/23/2017  5:36 AM Date of Discharge: 06/24/2017 Attending Physician: Dr. Jessy OtoAlexander Raines   Discharge Diagnosis: Principal Problem:   Acute pulmonary embolus [facility](HCC) Active Problems:   Acute deep vein thrombosis (DVT) of popliteal vein of right lower extremity [facility](HCC)   Discharge Medications: Allergies as of 06/24/2017      Reactions   Sulfa Antibiotics Rash   Sulfasalazine Rash      Medication List    STOP taking these medications   aspirin 81 MG chewable tablet   NATTOKINASE PO     TAKE these medications   CAYENNE PO Take 500 mg by mouth daily.   Garlic 100 MG Tabs Take 100 mg by mouth daily.   Rivaroxaban 15 MG Tabs tablet Commonly known as:  XARELTO Take 1 tablet (15 mg total) by mouth 2 (two) times daily with a meal.   rivaroxaban 20 MG Tabs tablet Commonly known as:  XARELTO Take 1 tablet (20 mg total) by mouth daily with supper. Start taking on:  07/14/2017   Turmeric 450 MG Caps Take by mouth 2 (two) times daily.   Vitamin D3 5000 units Tabs Take 5,000 Units by mouth daily.       Disposition and follow-up:   Ms.Olivia Harrison was [facility] in Good condition.  At the hospital follow up visit please address:  1.  --Assess respiratory sx and adherence to anti-coagulation --F/u prior hyper-coagulable workup initiated by PCP   2.  Labs / imaging needed at time of follow-up: None  3.  Pending labs/ test needing follow-up: None   Follow-up Appointments: Follow-up Information    Shade FloodGreene, Jeffrey R, MD. Schedule an appointment as soon as possible for a visit in 1 week(s).   Specialties:  Family Medicine, Sports Medicine Why:  Please follow up with Dr. Neva SeatGreene or another provider [facility] in order to continue the workup for blood clotting disorder which was already started. They can also follow up your  treatment with Xarelto. Contact information: [address] [place] [address] [phone]           Hospital Course by problem list:   Acute PE, Recent DVTs Presented in R sided back pain worse with deep inspiration in the setting of known R DVT discovered in January, and probable left leg DVT after onset of L leg calf pain one week ago. She had previously been seen by primary care doctor who initiated hypercoagulability workup as these events were considered unprovoked. At the time, she declined anti-coagulation in favor of managing with supplements. CTA on admission revealed RLL PE with no R heart strain, she was hemodynamically stable on presentation and throughout admission. She was started on anti-coagulation with Xarelto and observed overnight before being discharged. She will likely need long-term anti-coagulation at the least. She will make a f/u appt with [facility] for close outpatient follow up.     Discharge Vitals:   BP 115/60 (BP Location: Left Arm)   Pulse 65   Temp 98.1 F (36.7 C) (Oral)   Resp 18   Ht 5\' 6"  (1.676 m)   Wt 150 lb 14.4 oz (68.4 kg)   LMP 06/20/2017 (Exact Date) Comment: currently on   SpO2 98%   BMI 24.36 kg/m   Pertinent Labs, Studies, and Procedures:  CBC Latest Ref Rng & Units 06/23/2017 06/17/2017 06/14/2016  WBC 4.0 - 10.5 K/uL  6.2 4.8 4.8  Hemoglobin 12.0 - 15.0 g/dL 72.5 36.6 44.0  Hematocrit 36.0 - 46.0 % 39.4 38.9 40.1  Platelets 150 - 400 K/uL 171 159 189   CTA: Acute pulmonary embolus in the right lower lobe lobar pulmonary artery. No right heart strain.  Discharge Instructions: Discharge Instructions    Discharge instructions   Complete by:  As directed    It was a pleasure to meet you Olivia Harrison --Be sure to pick up your Xarelto from the pharmacy and start taking the 15 mg tablets twice a day starting this evening (you got the morning dose here). On March 14th, you'll switch to taking the Xarelto once a day with 20 mg  tablets --For exercise, you may have some more shortness of breath and discomfort and feel more "out of shape" compared to what you're used to for yourself. Exercising is safe and you can do what your body tolerates but it is recommended to gradually increase your exercise regimen. You can also wear compression leggings or stockings to help with any leg pain or swelling that may happen with exercise.  --Be sure to make a follow up appointment with your primary care doctor (the preferred doctor in the same practice) to see how you are doing --Keep an eye on your menstrual cycles and the amount of bleeding. Your primary doctor may want to check on your blood counts periodically if the xarelto does cause your periods to be heavier or longer   Increase activity slowly   Complete by:  As directed       Signed: Ginger Carne, MD 06/24/2017, 2:29 PM   Pager: [phone]

## 2017-06-24 NOTE — Progress Notes (Signed)
Discharge instructions (including medications) discussed with and copy provided to patient/caregiver 

## 2017-06-27 ENCOUNTER — Encounter: Payer: Self-pay | Admitting: Obstetrics & Gynecology

## 2017-06-27 ENCOUNTER — Other Ambulatory Visit (HOSPITAL_COMMUNITY)
Admission: RE | Admit: 2017-06-27 | Discharge: 2017-06-27 | Disposition: A | Discharge status: Home / Self Care | Payer: 59 | Source: Ambulatory Visit | Attending: Obstetrics & Gynecology | Admitting: Obstetrics & Gynecology

## 2017-06-27 ENCOUNTER — Ambulatory Visit: Payer: 59 | Admitting: Obstetrics & Gynecology

## 2017-06-27 ENCOUNTER — Other Ambulatory Visit: Payer: Self-pay

## 2017-06-27 ENCOUNTER — Telehealth: Payer: Self-pay | Admitting: Obstetrics and Gynecology

## 2017-06-27 ENCOUNTER — Telehealth: Payer: Self-pay | Admitting: *Deleted

## 2017-06-27 VITALS — BP 132/60 | HR 80 | Temp 98.3°F | Resp 16 | Wt 155.0 lb

## 2017-06-27 DIAGNOSIS — I82431 Acute embolism and thrombosis of right popliteal vein: Secondary | ICD-10-CM

## 2017-06-27 DIAGNOSIS — I2699 Other pulmonary embolism without acute cor pulmonale: Secondary | ICD-10-CM

## 2017-06-27 DIAGNOSIS — Z01419 Encounter for gynecological examination (general) (routine) without abnormal findings: Secondary | ICD-10-CM | POA: Diagnosis not present

## 2017-06-27 DIAGNOSIS — Z124 Encounter for screening for malignant neoplasm of cervix: Secondary | ICD-10-CM

## 2017-06-27 DIAGNOSIS — N921 Excessive and frequent menstruation with irregular cycle: Secondary | ICD-10-CM

## 2017-06-27 DIAGNOSIS — N926 Irregular menstruation, unspecified: Secondary | ICD-10-CM | POA: Diagnosis not present

## 2017-06-27 NOTE — Telephone Encounter (Signed)
Left message to call Noreene LarssonJill at [phone].   See OV notes dated 06/27/17 regarding referral to hematology.

## 2017-06-27 NOTE — Telephone Encounter (Signed)
Routing to Dr. Hyacinth MeekerMiller -please review and advise?

## 2017-06-27 NOTE — Progress Notes (Signed)
Spoke with patient while in office. Call placed to Dr. Myna HidalgoEnnever, hematology/oncology. Was advised referral coordinator is out, left message for Nurse to return [facility].    Advised patient can return call once OV scheduled with hematology, patient is flexible with time and date, agreeable to plan. Telephone number provided [phone]. See telephone encounter dated 06/27/17 for f/u.

## 2017-06-27 NOTE — Telephone Encounter (Signed)
Spoke with patient. Patient see in ER on 06/23/17, Dx acute pulmonary embolism, recent hx of RLE DVT. Started on Xarelto 15 mg BID.   Hx of regular cycles, tubal ligation for contraceptive.   LMP 06/20/17, cycle has continued longer since starting blood thinner and patient is concerned. Bleeding increased on 2/24, changing a 10hr pad q 2hr, soaking through clothing and bedding, clots "the size of bowel movements". Patient adamant that she does not want to start a "hormone pill" to stop bleeding, feels this is r/t blood thinner.   Reports weakness, states this has been ongoing since PE diagnosis and upper back pain unchanged since ER visit. Denies SOB.   Instructed patient to call PCP for f/u, will also review with Dr. Oscar LaJertson. Advised patient Dr. Oscar LaJertson is in surgery, response may not be immediate. Patient verbalizes understanding and is agreeable.    Dr. Oscar LaJertson -please review and advise?

## 2017-06-27 NOTE — Telephone Encounter (Signed)
Patient was seen at the ED and was diagnosed with an acute pulmonary embolism. She was put on a blood thinner and has been bleeding heavily every since and has passed a huge clot.
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
## 2017-06-27 NOTE — Telephone Encounter (Signed)
Spoke with Olivia Harrison at Dr. Gustavo LahEnnever's office. Requested OV to evaluate and consult regarding medication adjustment to impact bleeding positively, recent DX of DVT and PE. Patient seen in office today for irregular bleeding and menorrhagia after starting Xarelto. Patient declined progesterone, skipped morning dose of Xarelto.    Was advised Dr. Myna HidalgoEnnever and Emeline GinsSarah [place] are out of the office today, will return on 06/27/17. Olivia Harrison states she will review 06/27/17 OV notes with Dr. Myna HidalgoEnnever on 2/26 and return call regarding scheduling.

## 2017-06-27 NOTE — Telephone Encounter (Signed)
Spoke with patient, provided update, is agreeable to plan.

## 2017-06-27 NOTE — Telephone Encounter (Signed)
Reviewed with Dr. Hyacinth MeekerMiller, schedule OV for today for further evaluation, call returned to patient. OV scheduled for today at 1:45pm with Dr. Hyacinth MeekerMiller. Patient verbalizes understanding and is agreeable.   Routing to provider for final review. Patient is agreeable to disposition. Will close encounter.   Cc: Dr. Oscar LaJertson

## 2017-06-28 ENCOUNTER — Telehealth: Payer: Self-pay | Admitting: *Deleted

## 2017-06-28 DIAGNOSIS — N852 Hypertrophy of uterus: Secondary | ICD-10-CM

## 2017-06-28 DIAGNOSIS — N921 Excessive and frequent menstruation with irregular cycle: Secondary | ICD-10-CM

## 2017-06-28 DIAGNOSIS — N926 Irregular menstruation, unspecified: Secondary | ICD-10-CM

## 2017-06-28 LAB — HEMOGLOBIN: Hemoglobin: 12.6

## 2017-06-28 NOTE — Telephone Encounter (Signed)
Spoke with Joellen at Dr. Gustavo LahEnnever's office. Patient is scheduled for OV with Dr. Myna HidalgoEnnever on 2/28 at 4pm.   Call returned to patient, advised of appointment as seen above. Patient states bleeding has continued to taper down, patient thankful for the assistance, verbalizes understanding and is agreeable.  Routing to provider for final review. Patient is agreeable to disposition. Will close encounter.

## 2017-06-28 NOTE — Telephone Encounter (Signed)
Reviewed with Dr. Hyacinth MeekerMiller -recommended PUS for further evaluation of enlarged uterus.   Spoke with patient, advised as seen above per Dr. Hyacinth MeekerMiller. PUS scheduled for 07/07/17 at 2pm, consult to follow at 2:30pm with Dr. Hyacinth MeekerMiller. Patient verbalizes understanding and is agreeable.   PUS order placed.   Routing to provider for final review. Patient is agreeable to disposition. Will close encounter.   Cc: Harland DingwallSuzy Dixon

## 2017-06-30 ENCOUNTER — Other Ambulatory Visit: Payer: Self-pay

## 2017-06-30 ENCOUNTER — Inpatient Hospital Stay: Payer: 59

## 2017-06-30 ENCOUNTER — Encounter: Payer: Self-pay | Admitting: Hematology & Oncology

## 2017-06-30 ENCOUNTER — Inpatient Hospital Stay: Payer: 59 | Attending: Hematology & Oncology | Admitting: Hematology & Oncology

## 2017-06-30 VITALS — BP 103/65 | HR 89 | Temp 97.8°F | Resp 17 | Wt 155.0 lb

## 2017-06-30 DIAGNOSIS — M542 Cervicalgia: Secondary | ICD-10-CM | POA: Insufficient documentation

## 2017-06-30 DIAGNOSIS — Z800 Family history of malignant neoplasm of digestive organs: Secondary | ICD-10-CM

## 2017-06-30 DIAGNOSIS — R1033 Periumbilical pain: Secondary | ICD-10-CM

## 2017-06-30 DIAGNOSIS — Z7901 Long term (current) use of anticoagulants: Secondary | ICD-10-CM

## 2017-06-30 DIAGNOSIS — I82431 Acute embolism and thrombosis of right popliteal vein: Secondary | ICD-10-CM

## 2017-06-30 DIAGNOSIS — E7800 Pure hypercholesterolemia, unspecified: Secondary | ICD-10-CM | POA: Diagnosis not present

## 2017-06-30 DIAGNOSIS — Z79899 Other long term (current) drug therapy: Secondary | ICD-10-CM | POA: Diagnosis not present

## 2017-06-30 DIAGNOSIS — R634 Abnormal weight loss: Secondary | ICD-10-CM | POA: Diagnosis not present

## 2017-06-30 DIAGNOSIS — Z803 Family history of malignant neoplasm of breast: Secondary | ICD-10-CM

## 2017-06-30 DIAGNOSIS — I2692 Saddle embolus of pulmonary artery without acute cor pulmonale: Secondary | ICD-10-CM | POA: Diagnosis not present

## 2017-06-30 DIAGNOSIS — G8929 Other chronic pain: Secondary | ICD-10-CM | POA: Diagnosis not present

## 2017-06-30 LAB — CYTOLOGY - PAP
DIAGNOSIS: NEGATIVE
HPV (WINDOPATH): NOT DETECTED
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
## 2017-07-01 ENCOUNTER — Telehealth: Payer: Self-pay | Admitting: *Deleted

## 2017-07-01 NOTE — Telephone Encounter (Signed)
Spoke with patient, PUS rescheduled to 07/26/17 at 3:30pm with consult to follow at 4pm. Patient verbalizes understanding and is agreeable.  Routing to provider for final review. Patient is agreeable to disposition. Will close encounter.

## 2017-07-01 NOTE — Telephone Encounter (Signed)
[facility] patient requesting to reschedule PUS scheduled on 3/7. Patient also request to cancel AEX scheduled with Dr. Oscar LaJertson on 07/06/17, pap completed at last OV with Dr. Hyacinth MeekerMiller, request to see Dr. Hyacinth MeekerMiller for AEX. Advised will review PUS schedule with nursing supervisor and return call, patient agreeable.

## 2017-07-04 ENCOUNTER — Encounter: Payer: Self-pay | Admitting: Obstetrics & Gynecology

## 2017-07-04 LAB — HYPERCOAGULABLE PANEL, COMPREHENSIVE
ACT. PRT C RESIST W/FV DEFIC.: 2.8 ratio
APTT: 25 s
AT III Act/Nor PPP Chro: 102 %
Anticardiolipin Ab, IgM: <10 [MPL'U]
Beta-2 Glycoprotein I, IgG: <10 SGU
DRVVT SCREEN SECONDS: 31.9 s
Factor VII Antigen**: 95 %
Factor VIII Activity: 123 %
Hexagonal Phospholipid Neutral: 3 s
Homocysteine: 13.4 umol/L
PROTEIN S AG/FVII AG RATIO: 0.9 ratio
Prot C Ag Act/Nor PPP Imm: 122 %
Prot S Ag Act/Nor PPP Imm: 86 %
Protein C Ag/FVII Ag Ratio**: 1.3 ratio

## 2017-07-06 ENCOUNTER — Ambulatory Visit: Payer: Self-pay | Admitting: Obstetrics and Gynecology

## 2017-07-07 ENCOUNTER — Other Ambulatory Visit: Payer: Self-pay | Admitting: Obstetrics & Gynecology

## 2017-07-07 ENCOUNTER — Other Ambulatory Visit: Payer: Self-pay

## 2017-07-08 ENCOUNTER — Inpatient Hospital Stay: Payer: 59 | Attending: Hematology & Oncology

## 2017-07-08 ENCOUNTER — Ambulatory Visit (HOSPITAL_COMMUNITY)
Admission: RE | Admit: 2017-07-08 | Discharge: 2017-07-08 | Disposition: A | Discharge status: Home / Self Care | Payer: 59 | Source: Ambulatory Visit | Attending: Hematology & Oncology | Admitting: Hematology & Oncology

## 2017-07-08 DIAGNOSIS — Z86718 Personal history of other venous thrombosis and embolism: Secondary | ICD-10-CM | POA: Diagnosis present

## 2017-07-08 DIAGNOSIS — K8020 Calculus of gallbladder without cholecystitis without obstruction: Secondary | ICD-10-CM | POA: Diagnosis not present

## 2017-07-08 DIAGNOSIS — R1033 Periumbilical pain: Secondary | ICD-10-CM | POA: Diagnosis not present

## 2017-07-08 DIAGNOSIS — Z86711 Personal history of pulmonary embolism: Secondary | ICD-10-CM | POA: Insufficient documentation

## 2017-07-08 DIAGNOSIS — I82431 Acute embolism and thrombosis of right popliteal vein: Secondary | ICD-10-CM

## 2017-07-08 DIAGNOSIS — I2692 Saddle embolus of pulmonary artery without acute cor pulmonale: Secondary | ICD-10-CM

## 2017-07-09 LAB — PROTEIN C ACTIVITY: Protein C Activity: 155 % (ref 73–180)

## 2017-07-09 LAB — PROTEIN S, TOTAL AND FREE
PROTEIN S AG FREE: 43 % — AB (ref 57–157)
Protein S Ag, Total: 80 % (ref 60–150)

## 2017-07-11 LAB — FACTOR 5 LEIDEN

## 2017-07-11 LAB — HOMOCYSTEINE: Homocysteine: 11.3 umol/L (ref 0.0–15.0)

## 2017-07-12 DIAGNOSIS — S060X0A Concussion without loss of consciousness, initial encounter: Secondary | ICD-10-CM | POA: Diagnosis not present

## 2017-07-12 DIAGNOSIS — I2699 Other pulmonary embolism without acute cor pulmonale: Secondary | ICD-10-CM | POA: Diagnosis not present

## 2017-07-12 DIAGNOSIS — M542 Cervicalgia: Secondary | ICD-10-CM | POA: Diagnosis not present

## 2017-07-12 LAB — PROTHROMBIN GENE MUTATION

## 2017-07-12 LAB — PROTEIN C, TOTAL: PROTEIN C, TOTAL: 126 % (ref 60–150)

## 2017-07-15 ENCOUNTER — Telehealth: Payer: Self-pay | Admitting: *Deleted

## 2017-07-15 NOTE — Telephone Encounter (Signed)
Patient called asking about the results of her recent blood work and Ultrasound. Dr. Myna HidalgoEnnever notified of this.  States he has to call her himself.  Gave patient this information but told her I wasn't sure if he would be able to call tonight or not.  Gave Dr. Myna HidalgoEnnever note with 2 different numbers to call to reach patient.

## 2017-07-18 ENCOUNTER — Other Ambulatory Visit: Payer: Self-pay | Admitting: Hematology & Oncology

## 2017-07-18 MED ORDER — FOLIC ACID 1 MG PO TABS: 1.0000 mg | ORAL_TABLET | Freq: Every day | ORAL | 12 refills | Status: AC

## 2017-07-22 ENCOUNTER — Telehealth: Payer: Self-pay | Admitting: Obstetrics & Gynecology

## 2017-07-22 NOTE — Telephone Encounter (Signed)
Spoke with patient. Appointment rescheduled for 08/09/2017 at 3 pm with 3:30 pm consult with Dr.Miller. Patient is agreeable to date and time.  Routing to provider for final review. Patient agreeable to disposition. Will close encounter.

## 2017-07-22 NOTE — Telephone Encounter (Signed)
Patient canceled her upcoming PUS appointment 07/26/17 via the automated reminder call. I called the patient to reschedule and a female answered stating that Olivia Harrison was unable to come to the phone and Olivia Harrison would call the office later.

## 2017-07-22 NOTE — Telephone Encounter (Signed)
Patient to ready to reschedule her PUS/OV from 07/26/17.

## 2017-07-26 ENCOUNTER — Other Ambulatory Visit: Payer: Self-pay | Admitting: Obstetrics & Gynecology

## 2017-07-26 ENCOUNTER — Other Ambulatory Visit: Payer: Self-pay

## 2017-08-09 ENCOUNTER — Other Ambulatory Visit: Payer: 59 | Admitting: Obstetrics & Gynecology

## 2017-08-09 ENCOUNTER — Other Ambulatory Visit: Payer: 59

## 2017-08-12 ENCOUNTER — Telehealth: Payer: Self-pay | Admitting: *Deleted
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
## 2017-08-18 ENCOUNTER — Ambulatory Visit (INDEPENDENT_AMBULATORY_CARE_PROVIDER_SITE_OTHER): Payer: 59

## 2017-08-18 ENCOUNTER — Encounter: Payer: Self-pay | Admitting: Obstetrics & Gynecology

## 2017-08-18 ENCOUNTER — Ambulatory Visit (INDEPENDENT_AMBULATORY_CARE_PROVIDER_SITE_OTHER): Payer: 59 | Admitting: Obstetrics & Gynecology

## 2017-08-18 VITALS — BP 110/60 | HR 76 | Resp 16 | Ht 66.0 in | Wt 168.8 lb

## 2017-08-18 DIAGNOSIS — N920 Excessive and frequent menstruation with regular cycle: Secondary | ICD-10-CM

## 2017-08-18 DIAGNOSIS — N921 Excessive and frequent menstruation with irregular cycle: Secondary | ICD-10-CM | POA: Diagnosis not present

## 2017-08-18 DIAGNOSIS — N926 Irregular menstruation, unspecified: Secondary | ICD-10-CM

## 2017-08-18 DIAGNOSIS — N852 Hypertrophy of uterus: Secondary | ICD-10-CM

## 2017-08-18 DIAGNOSIS — N841 Polyp of cervix uteri: Secondary | ICD-10-CM | POA: Diagnosis not present

## 2017-08-18 NOTE — Progress Notes (Signed)
37 y.o. [id number] Married Caucasian female here for pelvic ultrasound due to menorrhagia.  This started after started Xarelto for treatment of PE and DVT.  Cycles are regular and lasts 8 days.  The first three are the worst and are quite heavy.  Has to wear a 10 hour pad and must change every two hours for at least the first two days.    Evaluation has shown protein S deficiency.  Has follow up with Dr. Myna HidalgoEnnever June 13th.    Contraception: BLT  Findings:  UTERUS: 8.8 x 5.5 x 4.1cm EMS: 6.281mm with 1.7 x 0.4cm endocervical polyp ADNEXA: Left ovary: 3.1 x 1.7 x 1.6cm       Right ovary: 4.2 x 2.9 x 2.4cm CUL DE SAC: no free fluid  Discussion:  Findings reviewed.  Polyp finding discussed.  Given recent PE and likelihood of benign nature of polyp, would recommend repeating PUS in 4-5 months and after final dispositioning of management since DVT/PE after six months of Xarelto.  Procedure reviewed for hysteroscopy but I really think we should wait on this right now due to increased risk.  Pt is comfortable with plan of care.  Assessment:  Menorrhagia likely due Xarelto Cervical polyp  Plan:  Repeat PUS will be scheduled for four months from now.  Pt knows to call if has worsening changes with bleeding.  ~20 minutes spent with patient >50% of time was in face to face discussion of above.

## 2017-08-19 DIAGNOSIS — M542 Cervicalgia: Secondary | ICD-10-CM | POA: Diagnosis not present

## 2017-08-19 DIAGNOSIS — M545 Low back pain: Secondary | ICD-10-CM | POA: Diagnosis not present

## 2017-08-29 DIAGNOSIS — I824Z1 Acute embolism and thrombosis of unspecified deep veins of right distal lower extremity: Secondary | ICD-10-CM | POA: Diagnosis not present

## 2017-08-29 DIAGNOSIS — I82411 Acute embolism and thrombosis of right femoral vein: Secondary | ICD-10-CM | POA: Diagnosis not present

## 2017-09-02 DIAGNOSIS — M545 Low back pain: Secondary | ICD-10-CM | POA: Diagnosis not present

## 2017-09-02 DIAGNOSIS — M542 Cervicalgia: Secondary | ICD-10-CM | POA: Diagnosis not present

## 2017-09-07 ENCOUNTER — Other Ambulatory Visit: Payer: Self-pay | Admitting: *Deleted

## 2017-09-07 ENCOUNTER — Other Ambulatory Visit: Payer: Self-pay | Admitting: Internal Medicine

## 2017-09-07 MED ORDER — RIVAROXABAN 20 MG PO TABS: 20.0000 mg | ORAL_TABLET | Freq: Every day | ORAL | 11 refills | Status: DC

## 2017-09-16 DIAGNOSIS — M542 Cervicalgia: Secondary | ICD-10-CM | POA: Diagnosis not present

## 2017-09-16 DIAGNOSIS — M545 Low back pain: Secondary | ICD-10-CM | POA: Diagnosis not present

## 2017-10-13 ENCOUNTER — Inpatient Hospital Stay: Payer: 59 | Attending: Hematology & Oncology

## 2017-10-13 ENCOUNTER — Ambulatory Visit (HOSPITAL_BASED_OUTPATIENT_CLINIC_OR_DEPARTMENT_OTHER)
Admission: RE | Admit: 2017-10-13 | Discharge: 2017-10-13 | Disposition: A | Discharge status: Home / Self Care | Payer: 59 | Source: Ambulatory Visit | Attending: Hematology & Oncology | Admitting: Hematology & Oncology

## 2017-10-13 ENCOUNTER — Encounter (HOSPITAL_BASED_OUTPATIENT_CLINIC_OR_DEPARTMENT_OTHER): Payer: Self-pay

## 2017-10-13 ENCOUNTER — Inpatient Hospital Stay (HOSPITAL_BASED_OUTPATIENT_CLINIC_OR_DEPARTMENT_OTHER): Payer: 59 | Admitting: Hematology & Oncology

## 2017-10-13 ENCOUNTER — Other Ambulatory Visit: Payer: Self-pay

## 2017-10-13 ENCOUNTER — Encounter: Payer: Self-pay | Admitting: Hematology & Oncology

## 2017-10-13 VITALS — BP 106/63 | HR 63 | Temp 98.9°F | Resp 18 | Wt 176.0 lb

## 2017-10-13 DIAGNOSIS — I82431 Acute embolism and thrombosis of right popliteal vein: Secondary | ICD-10-CM | POA: Insufficient documentation

## 2017-10-13 DIAGNOSIS — J9811 Atelectasis: Secondary | ICD-10-CM | POA: Diagnosis not present

## 2017-10-13 DIAGNOSIS — Z7901 Long term (current) use of anticoagulants: Secondary | ICD-10-CM | POA: Diagnosis not present

## 2017-10-13 DIAGNOSIS — I2692 Saddle embolus of pulmonary artery without acute cor pulmonale: Secondary | ICD-10-CM

## 2017-10-13 DIAGNOSIS — Z79899 Other long term (current) drug therapy: Secondary | ICD-10-CM | POA: Diagnosis not present

## 2017-10-13 DIAGNOSIS — I82439 Acute embolism and thrombosis of unspecified popliteal vein: Secondary | ICD-10-CM | POA: Diagnosis not present

## 2017-10-13 DIAGNOSIS — Z86718 Personal history of other venous thrombosis and embolism: Secondary | ICD-10-CM | POA: Insufficient documentation

## 2017-10-13 DIAGNOSIS — I2699 Other pulmonary embolism without acute cor pulmonale: Secondary | ICD-10-CM | POA: Diagnosis not present

## 2017-10-13 DIAGNOSIS — Z86711 Personal history of pulmonary embolism: Secondary | ICD-10-CM | POA: Insufficient documentation

## 2017-10-13 DIAGNOSIS — M7989 Other specified soft tissue disorders: Secondary | ICD-10-CM | POA: Insufficient documentation

## 2017-10-13 DIAGNOSIS — D508 Other iron deficiency anemias: Secondary | ICD-10-CM

## 2017-10-13 DIAGNOSIS — D6859 Other primary thrombophilia: Secondary | ICD-10-CM

## 2017-10-13 LAB — CMP (CANCER CENTER ONLY)
ALBUMIN: 4.2 g/dL (ref 3.5–5.0)
ALT: 9 U/L (ref 0–55)
ANION GAP: 7 (ref 3–11)
AST: 17 U/L (ref 5–34)
Alkaline Phosphatase: 49 U/L (ref 40–150)
BUN: 14 mg/dL (ref 7–26)
CHLORIDE: 102 mmol/L (ref 98–109)
CO2: 27 mmol/L (ref 22–29)
Calcium: 9.7 mg/dL (ref 8.4–10.4)
Creatinine: 0.81 mg/dL (ref 0.60–1.10)
GFR, Est AFR Am: >60 mL/min (ref >60)
GFR, Estimated: >60 mL/min (ref >60)
GLUCOSE: 78 mg/dL (ref 70–140)
POTASSIUM: 4.5 mmol/L (ref 3.5–5.1)
Sodium: 136 mmol/L (ref 136–145)
Total Bilirubin: 0.6 mg/dL (ref 0.2–1.2)
Total Protein: 7.5 g/dL (ref 6.4–8.3)

## 2017-10-13 LAB — CBC WITH DIFFERENTIAL (CANCER CENTER ONLY)
BASOS ABS: 0 10*3/uL (ref 0.0–0.1)
BASOS PCT: 0 %
EOS ABS: 0.1 10*3/uL (ref 0.0–0.5)
EOS PCT: 2 %
HCT: 38.5 % (ref 34.8–46.6)
Hemoglobin: 12.6 g/dL (ref 11.6–15.9)
Lymphocytes Relative: 29 %
Lymphs Abs: 1.4 10*3/uL (ref 0.9–3.3)
MCH: 29.8 pg (ref 26.0–34.0)
MCHC: 32.7 g/dL (ref 32.0–36.0)
MCV: 91 fL (ref 81.0–101.0)
MONO ABS: 0.3 10*3/uL (ref 0.1–0.9)
Monocytes Relative: 7 %
Neutro Abs: 2.9 10*3/uL (ref 1.5–6.5)
Neutrophils Relative %: 62 %
PLATELETS: 165 10*3/uL (ref 145–400)
RBC: 4.23 MIL/uL (ref 3.70–5.32)
RDW: 12.9 % (ref 11.1–15.7)
WBC Count: 4.7 10*3/uL (ref 3.9–10.0)

## 2017-10-13 LAB — D-DIMER, QUANTITATIVE (NOT AT ARMC)

## 2017-10-13 MED ORDER — IOPAMIDOL (ISOVUE-370) INJECTION 76%
100.0000 mL | Freq: Once | INTRAVENOUS | Status: AC | PRN
  Administered 2017-10-13: 100 mL via INTRAVENOUS

## 2017-10-13 NOTE — Progress Notes (Signed)
Hematology and Oncology Follow Up Visit  Olivia Harrison [id number] Nov 25, 1980 37 y.o. 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 daily-to complete 6 months in September 2019     Interim History:  Olivia Harrison is back for follow-up.  She is doing pretty well.  We did go ahead and do a CT angiogram on her chest today.  This showed resolution of the embolus in the right lower lobe artery.  A ultrasound of the right leg showed near resolution of the thrombus.  There is a small nonocclusive chronic thrombus in the distal popliteal vein extending into the tibial peroneal trunk and gastrocnemius vein.  There is no thrombus in the femoral vein.  She is exercising.  She feels well.  She has occasional swelling in the right leg.  There is no chest wall pain.  She does have a compression stocking that she wears.  We did do a hypercoagulable panel on her.  She has a very mildly low Protein S level of 43%.  I am not sure if this is truly clinically significant.  She says that her monthly cycles are not as bad.  She has had no fever.  She has had no obvious change in bowel or bladder habits.  Overall, her performance status is ECOG 1.  Medications:  Current Outpatient Medications:  .  Capsicum, Cayenne, (CAYENNE PO), Take 500 mg by mouth daily. , Disp: , Rfl:  .  Cholecalciferol (VITAMIN D3) 5000 units TABS, Take 5,000 Units by mouth daily., Disp: , Rfl:  .  folic acid (FOLVITE) 1 MG tablet, Take 1 tablet (1 mg total) by mouth daily., Disp: 30 tablet, Rfl: 12 .  Garlic 100 MG TABS, Take 100 mg by mouth daily. , Disp: , Rfl:  .  rivaroxaban (XARELTO) 20 MG TABS tablet, Take 1 tablet (20 mg total) by mouth daily with supper., Disp: 30 tablet, Rfl: 11 .  Turmeric 450 MG CAPS, Take by mouth 2 (two) times  daily., Disp: , Rfl:   Allergies:  Allergies  Allergen Reactions  . Sulfa Antibiotics Rash  . Sulfasalazine Rash    Past Medical History, Surgical history, Social history, and Family History were reviewed and updated.  Review of Systems: Review of Systems  Constitutional: Negative.   HENT:  Negative.   Eyes: Negative.   Respiratory: Negative.   Cardiovascular: Negative.   Gastrointestinal: Negative.   Endocrine: Negative.   Genitourinary: Negative.    Musculoskeletal: Negative.   Skin: Negative.   Neurological: Negative.   Hematological: Negative.   Psychiatric/Behavioral: Negative.     Physical Exam:  weight is 176 lb (79.8 kg). Her oral temperature is 98.9 F (37.2 C). Her blood pressure is 106/63 and her pulse is 63. Her respiration is 18 and oxygen saturation is 100%.   Wt Readings from Last 3 Encounters:  10/13/17 176 lb (79.8 kg)  08/18/17 168 lb 12.8 oz (76.6 kg)  06/30/17 155 lb (70.3 kg)    Physical Exam  Constitutional: She is oriented to person, place, and time.  HENT:  Head: Normocephalic and atraumatic.  Mouth/Throat: Oropharynx is clear and moist.  Eyes: Pupils are equal, round, and reactive to light. EOM are normal.  Neck: Normal range of motion.  Cardiovascular: Normal rate, regular rhythm and normal heart sounds.  Pulmonary/Chest: Effort normal and  breath sounds normal.  Abdominal: Soft. Bowel sounds are normal.  Musculoskeletal: Normal range of motion. She exhibits no edema, tenderness or deformity.  Lymphadenopathy:    She has no cervical adenopathy.  Neurological: She is alert and oriented to person, place, and time.  Skin: Skin is warm and dry. No rash noted. No erythema.  Psychiatric: She has a normal mood and affect. Her behavior is normal. Judgment and thought content normal.  Vitals reviewed.    Lab Results  Component Value Date   WBC 4.7 10/13/2017   HGB 12.6 10/13/2017   HCT 38.5 10/13/2017   MCV 91.0 10/13/2017   PLT 165  10/13/2017     Chemistry      Component Value Date/Time   NA 136 06/23/2017 0455   NA 138 06/17/2017 1338   K 3.9 06/23/2017 0455   CL 101 06/23/2017 0455   CO2 23 06/23/2017 0455   BUN 11 06/23/2017 0455   BUN 20 06/17/2017 1338   CREATININE 0.85 06/23/2017 0455   CREATININE 0.71 06/14/2016 1434      Component Value Date/Time   CALCIUM 9.1 06/23/2017 0455   ALKPHOS 45 06/17/2017 1338   AST 13 06/17/2017 1338   ALT 9 06/17/2017 1338   BILITOT 0.5 06/17/2017 1338      Impression and Plan: Ms. Olivia Harrison is a 37 year old Afro-American female.  She has a mildly depressed Protein S level.  For right now, we will continue on the full dose Xarelto.  I think after 6 months, we can probably get her on low-dose Xarelto for 1 year.  I would like to see her back in 3 months.  I will repeat her Protein S level.  I will also do a Doppler to see if there is any further improvement of the thrombus in her right leg.  She is going to the beach.  I told her to make sure that she drinks a lot of water.  She needs to wear a compression stocking when she is driving.  She is to get out and walk after 1 hour of driving to help get blood flowing through her leg.   Josph MachoPeter R Ennever, MD 6/13/20191:00 PM

## 2017-10-14 LAB — FERRITIN: Ferritin: 4 ng/mL — ABNORMAL LOW (ref 9–269)

## 2017-10-14 LAB — IRON AND TIBC
Iron: 51 ug/dL (ref 41–142)
SATURATION RATIOS: 13 % — AB (ref 21–57)
TIBC: 394 ug/dL (ref 236–444)
UIBC: 343 ug/dL

## 2017-10-25 ENCOUNTER — Telehealth: Payer: Self-pay | Admitting: *Deleted

## 2017-10-25 NOTE — Telephone Encounter (Signed)
Patient notified per order of Dr. Myna HidalgoEnnever that iron is low and to try a OTC iron supplement.  Pt informed to take Ferrous Sulfate 325 mg PO daily and to inform office if she is unable to tolerate it.  Patient appreciative of call back and has no questions at this time.

## 2017-10-25 NOTE — Telephone Encounter (Signed)
-----   Message from Josph MachoPeter R Ennever, MD sent at 10/25/2017  6:50 AM EDT ----- Call - the iron is low.  Try some OTC iron supplement.  pete

## 2017-11-10 DIAGNOSIS — M542 Cervicalgia: Secondary | ICD-10-CM | POA: Diagnosis not present

## 2017-11-10 DIAGNOSIS — M545 Low back pain: Secondary | ICD-10-CM | POA: Diagnosis not present

## 2017-11-16 DIAGNOSIS — M545 Low back pain: Secondary | ICD-10-CM | POA: Diagnosis not present

## 2017-11-16 DIAGNOSIS — M542 Cervicalgia: Secondary | ICD-10-CM | POA: Diagnosis not present

## 2017-11-18 DIAGNOSIS — S060X0A Concussion without loss of consciousness, initial encounter: Secondary | ICD-10-CM | POA: Diagnosis not present

## 2017-11-18 DIAGNOSIS — M542 Cervicalgia: Secondary | ICD-10-CM | POA: Diagnosis not present

## 2017-11-18 DIAGNOSIS — M5412 Radiculopathy, cervical region: Secondary | ICD-10-CM | POA: Diagnosis not present

## 2017-11-25 DIAGNOSIS — M542 Cervicalgia: Secondary | ICD-10-CM | POA: Diagnosis not present

## 2017-11-25 DIAGNOSIS — M5410 Radiculopathy, site unspecified: Secondary | ICD-10-CM | POA: Diagnosis not present

## 2017-12-09 DIAGNOSIS — S161XXS Strain of muscle, fascia and tendon at neck level, sequela: Secondary | ICD-10-CM | POA: Diagnosis not present

## 2017-12-09 DIAGNOSIS — M542 Cervicalgia: Secondary | ICD-10-CM | POA: Diagnosis not present

## 2017-12-09 DIAGNOSIS — S060X0A Concussion without loss of consciousness, initial encounter: Secondary | ICD-10-CM | POA: Diagnosis not present

## 2017-12-27 DIAGNOSIS — I824Z1 Acute embolism and thrombosis of unspecified deep veins of right distal lower extremity: Secondary | ICD-10-CM | POA: Diagnosis not present

## 2017-12-27 DIAGNOSIS — I82411 Acute embolism and thrombosis of right femoral vein: Secondary | ICD-10-CM | POA: Diagnosis not present

## 2018-01-12 ENCOUNTER — Other Ambulatory Visit: Payer: Self-pay

## 2018-01-12 ENCOUNTER — Inpatient Hospital Stay: Payer: 59

## 2018-01-12 ENCOUNTER — Encounter: Payer: Self-pay | Admitting: Hematology & Oncology

## 2018-01-12 ENCOUNTER — Inpatient Hospital Stay: Payer: 59 | Attending: Hematology & Oncology | Admitting: Hematology & Oncology

## 2018-01-12 VITALS — BP 119/61 | HR 64 | Temp 98.4°F | Resp 16 | Wt 173.0 lb

## 2018-01-12 DIAGNOSIS — Z7901 Long term (current) use of anticoagulants: Secondary | ICD-10-CM

## 2018-01-12 DIAGNOSIS — D696 Thrombocytopenia, unspecified: Secondary | ICD-10-CM | POA: Insufficient documentation

## 2018-01-12 DIAGNOSIS — Z79899 Other long term (current) drug therapy: Secondary | ICD-10-CM | POA: Diagnosis not present

## 2018-01-12 DIAGNOSIS — I82431 Acute embolism and thrombosis of right popliteal vein: Secondary | ICD-10-CM | POA: Diagnosis not present

## 2018-01-12 DIAGNOSIS — N920 Excessive and frequent menstruation with regular cycle: Secondary | ICD-10-CM | POA: Diagnosis not present

## 2018-01-12 DIAGNOSIS — D6859 Other primary thrombophilia: Secondary | ICD-10-CM | POA: Insufficient documentation

## 2018-01-12 DIAGNOSIS — D508 Other iron deficiency anemias: Secondary | ICD-10-CM

## 2018-01-12 LAB — CMP (CANCER CENTER ONLY)
ALK PHOS: 48 U/L (ref 38–126)
ALT: 9 U/L (ref 0–44)
ANION GAP: 9 (ref 5–15)
AST: 18 U/L (ref 15–41)
Albumin: 4.1 g/dL (ref 3.5–5.0)
BILIRUBIN TOTAL: 0.7 mg/dL (ref 0.3–1.2)
BUN: 14 mg/dL (ref 6–20)
CALCIUM: 9.4 mg/dL (ref 8.9–10.3)
CO2: 25 mmol/L (ref 22–32)
CREATININE: 0.99 mg/dL (ref 0.44–1.00)
Chloride: 102 mmol/L (ref 98–111)
GFR, Est AFR Am: >60 mL/min (ref >60)
GFR, Estimated: >60 mL/min (ref >60)
GLUCOSE: 73 mg/dL (ref 70–99)
Potassium: 4.1 mmol/L (ref 3.5–5.1)
Sodium: 136 mmol/L (ref 135–145)
TOTAL PROTEIN: 7.2 g/dL (ref 6.5–8.1)

## 2018-01-12 LAB — CBC WITH DIFFERENTIAL (CANCER CENTER ONLY)
Basophils Absolute: 0 10*3/uL (ref 0.0–0.1)
Basophils Relative: 0 %
EOS ABS: 0.1 10*3/uL (ref 0.0–0.5)
Eosinophils Relative: 1 %
HEMATOCRIT: 36.7 % (ref 34.8–46.6)
HEMOGLOBIN: 12.3 g/dL (ref 11.6–15.9)
LYMPHS ABS: 1.2 10*3/uL (ref 0.9–3.3)
LYMPHS PCT: 24 %
MCH: 30.7 pg (ref 26.0–34.0)
MCHC: 33.5 g/dL (ref 32.0–36.0)
MCV: 91.5 fL (ref 81.0–101.0)
Monocytes Absolute: 0.3 10*3/uL (ref 0.1–0.9)
Monocytes Relative: 7 %
NEUTROS ABS: 3.3 10*3/uL (ref 1.5–6.5)
NEUTROS PCT: 68 %
Platelet Count: 159 10*3/uL (ref 145–400)
RBC: 4.01 MIL/uL (ref 3.70–5.32)
RDW: 13.1 % (ref 11.1–15.7)
WBC: 4.8 10*3/uL (ref 3.9–10.0)

## 2018-01-12 MED ORDER — RIVAROXABAN 10 MG PO TABS: 10.0000 mg | ORAL_TABLET | Freq: Every day | ORAL | 11 refills | Status: AC

## 2018-01-12 NOTE — Progress Notes (Signed)
Hematology and Oncology Follow Up Visit  Olivia Harrison [id number] Nov 07, 1980 37 y.o. 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 daily-to complete 6 months in September 2019  Xarelto 10 mg po q day -- complete 1 yr in 01/2019      Interim History:  Olivia Harrison is back for follow-up.  She is doing pretty well.  She had a good summer.  She went to the beach.  She had no problems at the beach.  She has had no issues with the Xarelto.  I think we should decrease the dose now to a maintenance dose of 10 mg daily for 1 year.  She has had some heavy monthly cycles.  Hopefully the decreased dose of Xarelto will help with this.  She is had no cough or shortness of breath.  There is been no chest wall pain.  She recently had a little bit of a upper respiratory infection.  She had little bit of congestion.  She has had no leg swelling.  She is had no rashes.  She has had no fever.  Her appetite has been quite good.  Overall, her performance status is ECOG 1.   Medications:  Current Outpatient Medications:  .  Capsicum, Cayenne, (CAYENNE PO), Take 500 mg by mouth daily. , Disp: , Rfl:  .  Cholecalciferol (VITAMIN D3) 5000 units TABS, Take 5,000 Units by mouth daily., Disp: , Rfl:  .  folic acid (FOLVITE) 1 MG tablet, Take 1 tablet (1 mg total) by mouth daily., Disp: 30 tablet, Rfl: 12 .  Garlic 100 MG TABS, Take 100 mg by mouth daily. , Disp: , Rfl:  .  rivaroxaban (XARELTO) 20 MG TABS tablet, Take 1 tablet (20 mg total) by mouth daily with supper., Disp: 30 tablet, Rfl: 11 .  Turmeric 450 MG CAPS, Take by mouth 2 (two) times daily., Disp: , Rfl:   Allergies:  Allergies  Allergen Reactions  . Sulfa Antibiotics Rash  . Sulfasalazine Rash    Past Medical History, Surgical history,  Social history, and Family History were reviewed and updated.  Review of Systems: Review of Systems  Constitutional: Negative.   HENT:  Negative.   Eyes: Negative.   Respiratory: Negative.   Cardiovascular: Negative.   Gastrointestinal: Negative.   Endocrine: Negative.   Genitourinary: Negative.    Musculoskeletal: Negative.   Skin: Negative.   Neurological: Negative.   Hematological: Negative.   Psychiatric/Behavioral: Negative.     Physical Exam:  weight is 173 lb (78.5 kg). Her oral temperature is 98.4 F (36.9 C). Her blood pressure is 119/61 and her pulse is 64. Her respiration is 16 and oxygen saturation is 100%.   Wt Readings from Last 3 Encounters:  01/12/18 173 lb (78.5 kg)  10/13/17 176 lb (79.8 kg)  08/18/17 168 lb 12.8 oz (76.6 kg)    Physical Exam  Constitutional: She is oriented to person, place, and time.  HENT:  Head: Normocephalic and atraumatic.  Mouth/Throat: Oropharynx is clear and moist.  Eyes: Pupils are equal, round, and reactive to light. EOM are normal.  Neck: Normal range of motion.  Cardiovascular: Normal rate, regular rhythm and normal heart sounds.  Pulmonary/Chest: Effort normal and breath sounds normal.  Abdominal: Soft. Bowel sounds are normal.  Musculoskeletal: Normal range of motion. She exhibits no edema,  tenderness or deformity.  Lymphadenopathy:    She has no cervical adenopathy.  Neurological: She is alert and oriented to person, place, and time.  Skin: Skin is warm and dry. No rash noted. No erythema.  Psychiatric: She has a normal mood and affect. Her behavior is normal. Judgment and thought content normal.  Vitals reviewed.    Lab Results  Component Value Date   WBC 4.8 01/12/2018   HGB 12.3 01/12/2018   HCT 36.7 01/12/2018   MCV 91.5 01/12/2018   PLT 89 (L) 01/12/2018     Chemistry      Component Value Date/Time   NA 136 10/13/2017 1128   NA 138 06/17/2017 1338   K 4.5 10/13/2017 1128   CL 102 10/13/2017 1128    CO2 27 10/13/2017 1128   BUN 14 10/13/2017 1128   BUN 20 06/17/2017 1338   CREATININE 0.81 10/13/2017 1128   CREATININE 0.71 06/14/2016 1434      Component Value Date/Time   CALCIUM 9.7 10/13/2017 1128   ALKPHOS 49 10/13/2017 1128   AST 17 10/13/2017 1128   ALT 9 10/13/2017 1128   BILITOT 0.6 10/13/2017 1128      Impression and Plan: Olivia Harrison is a 37 year old Afro-American female.  She has a mildly depressed Protein S level.  We will go ahead and we were to the 10 mg daily dose of Xarelto.  We will do this for 1 year.  At some point, we might want to rethink rechecking her right leg to see how the thrombus is responding.  I would like to see her back in about 6 weeks.  Her platelet count was low today.  I am not sure why it was low.  I want to repeat this in 2 to 3 weeks to see if this is truly the case.    Josph MachoPeter R Genesis Paget, MD 9/12/20191:08 PM

## 2018-01-13 LAB — IRON AND TIBC
IRON: 44 ug/dL (ref 41–142)
Saturation Ratios: 14 % — ABNORMAL LOW (ref 21–57)
TIBC: 313 ug/dL (ref 236–444)
UIBC: 269 ug/dL

## 2018-01-13 LAB — PROTEIN S PANEL
PROTEIN S AG FREE: 48 % — AB (ref 57–157)
PROTEIN S AG TOTAL: 57 % — AB (ref 60–150)
Protein S Activity: 59 % — ABNORMAL LOW (ref 63–140)

## 2018-01-13 LAB — LUPUS ANTICOAGULANT PANEL
DRVVT: 40 s (ref 0.0–47.0)
PTT LA: 31.4 s (ref 0.0–51.9)

## 2018-01-13 LAB — FERRITIN: Ferritin: 17 ng/mL (ref 11–307)

## 2018-01-16 ENCOUNTER — Encounter: Payer: Self-pay | Admitting: *Deleted

## 2018-01-18 ENCOUNTER — Other Ambulatory Visit: Payer: Self-pay | Admitting: Obstetrics & Gynecology

## 2018-01-18 DIAGNOSIS — N841 Polyp of cervix uteri: Secondary | ICD-10-CM

## 2018-01-18 DIAGNOSIS — N920 Excessive and frequent menstruation with regular cycle: Secondary | ICD-10-CM

## 2018-01-24 ENCOUNTER — Encounter: Payer: Self-pay | Admitting: Obstetrics & Gynecology

## 2018-01-24 ENCOUNTER — Ambulatory Visit (INDEPENDENT_AMBULATORY_CARE_PROVIDER_SITE_OTHER): Payer: 59 | Admitting: Obstetrics & Gynecology

## 2018-01-24 ENCOUNTER — Other Ambulatory Visit: Payer: Self-pay

## 2018-01-24 ENCOUNTER — Other Ambulatory Visit: Payer: Self-pay | Admitting: Obstetrics & Gynecology

## 2018-01-24 ENCOUNTER — Ambulatory Visit (INDEPENDENT_AMBULATORY_CARE_PROVIDER_SITE_OTHER): Payer: 59

## 2018-01-24 VITALS — BP 102/60 | HR 60 | Resp 16 | Wt 176.0 lb

## 2018-01-24 DIAGNOSIS — N841 Polyp of cervix uteri: Secondary | ICD-10-CM | POA: Diagnosis not present

## 2018-01-24 DIAGNOSIS — N920 Excessive and frequent menstruation with regular cycle: Secondary | ICD-10-CM | POA: Diagnosis not present

## 2018-01-24 NOTE — Progress Notes (Signed)
37 y.o. [id number] Married AA female here for pelvic ultrasound due to h/o menorrhagia after starting Xarelto due to PE as well as recheck cervical polyp.  Pt reports she is doing very well.  Xarelto was decreased to 10mg  and this made a difference with her bleeding.  Plan is to stop Xarelto at a year after treatment began.  Follow up imaging does show PE fully resolved.  Energy is good.  Does not have SOB any longer..  Patient's last menstrual period was 01/22/2018.  Contraception: BLT  Findings:  UTERUS: 9.6 x 5.8 x 4.6cm with likely cervical polyp measuring 1.4cm.  Unchanged since prior exam. EMS: 3.414mm ADNEXA: Left ovary: 3.1 x 2.7 x 1.8cm       Right ovary: 2.8 x 2.0 x 2.0cm CUL DE SAC: no free fluid  Discussion:  Findings reviewed.  Polyp in cervix is stable.  Pt not interested in doing anything at this time for this especially since bleeding is improved with lower Xarelto dosing.  As pap was negative with neg HR HPV 2/19, do not feel additional treatment is needed at this time.  Indications for removal reviewed.  She does meet criteria for removal but with no growth, recent PE hx, current treatment with Xarelto, feel following conservatively is prudent as well.   Assessment:  Cervical polyp, stable on ultrasound   Plan:  Return for AEX as scheduled.  Consider repeating PUS in 1 year.   ~15 minutes spent with patient >50% of time was in face to face discussion of above.

## 2018-01-26 ENCOUNTER — Other Ambulatory Visit: Payer: 59

## 2018-02-07 ENCOUNTER — Other Ambulatory Visit: Payer: 59

## 2018-02-07 ENCOUNTER — Other Ambulatory Visit: Payer: 59 | Admitting: Obstetrics & Gynecology

## 2018-02-27 DIAGNOSIS — S060X0A Concussion without loss of consciousness, initial encounter: Secondary | ICD-10-CM | POA: Diagnosis not present

## 2018-03-09 DIAGNOSIS — M50223 Other cervical disc displacement at C6-C7 level: Secondary | ICD-10-CM | POA: Diagnosis not present

## 2018-03-09 DIAGNOSIS — M5124 Other intervertebral disc displacement, thoracic region: Secondary | ICD-10-CM | POA: Diagnosis not present

## 2018-03-09 DIAGNOSIS — M542 Cervicalgia: Secondary | ICD-10-CM | POA: Diagnosis not present

## 2018-03-09 DIAGNOSIS — M50323 Other cervical disc degeneration at C6-C7 level: Secondary | ICD-10-CM | POA: Diagnosis not present

## 2018-03-09 DIAGNOSIS — M791 Myalgia, unspecified site: Secondary | ICD-10-CM | POA: Diagnosis not present

## 2018-03-16 ENCOUNTER — Other Ambulatory Visit: Payer: 59

## 2018-03-16 ENCOUNTER — Ambulatory Visit: Payer: 59 | Admitting: Hematology & Oncology

## 2018-03-22 DIAGNOSIS — M791 Myalgia, unspecified site: Secondary | ICD-10-CM | POA: Diagnosis not present

## 2018-03-22 DIAGNOSIS — M542 Cervicalgia: Secondary | ICD-10-CM | POA: Diagnosis not present

## 2018-03-23 ENCOUNTER — Inpatient Hospital Stay (HOSPITAL_BASED_OUTPATIENT_CLINIC_OR_DEPARTMENT_OTHER): Payer: 59 | Admitting: Hematology & Oncology

## 2018-03-23 ENCOUNTER — Encounter: Payer: Self-pay | Admitting: Hematology & Oncology

## 2018-03-23 ENCOUNTER — Inpatient Hospital Stay: Payer: 59 | Attending: Hematology & Oncology

## 2018-03-23 ENCOUNTER — Other Ambulatory Visit: Payer: Self-pay

## 2018-03-23 VITALS — BP 125/70 | HR 74 | Temp 98.2°F | Resp 16 | Wt 177.0 lb

## 2018-03-23 DIAGNOSIS — I83892 Varicose veins of left lower extremities with other complications: Secondary | ICD-10-CM | POA: Diagnosis not present

## 2018-03-23 DIAGNOSIS — D6859 Other primary thrombophilia: Secondary | ICD-10-CM | POA: Insufficient documentation

## 2018-03-23 DIAGNOSIS — Z79899 Other long term (current) drug therapy: Secondary | ICD-10-CM | POA: Diagnosis not present

## 2018-03-23 DIAGNOSIS — Z7901 Long term (current) use of anticoagulants: Secondary | ICD-10-CM

## 2018-03-23 DIAGNOSIS — I82431 Acute embolism and thrombosis of right popliteal vein: Secondary | ICD-10-CM

## 2018-03-23 DIAGNOSIS — Z86711 Personal history of pulmonary embolism: Secondary | ICD-10-CM | POA: Diagnosis not present

## 2018-03-23 LAB — CBC WITH DIFFERENTIAL (CANCER CENTER ONLY)
Abs Immature Granulocytes: 0.01 10*3/uL (ref 0.00–0.07)
BASOS ABS: 0 10*3/uL (ref 0.0–0.1)
Basophils Relative: 1 %
EOS ABS: 0.2 10*3/uL (ref 0.0–0.5)
EOS PCT: 4 %
HEMATOCRIT: 41.3 % (ref 36.0–46.0)
HEMOGLOBIN: 13.2 g/dL (ref 12.0–15.0)
Immature Granulocytes: 0 %
Lymphocytes Relative: 33 %
Lymphs Abs: 1.4 10*3/uL (ref 0.7–4.0)
MCH: 30.4 pg (ref 26.0–34.0)
MCHC: 32 g/dL (ref 30.0–36.0)
MCV: 95.2 fL (ref 80.0–100.0)
MONO ABS: 0.3 10*3/uL (ref 0.1–1.0)
Monocytes Relative: 6 %
Neutro Abs: 2.3 10*3/uL (ref 1.7–7.7)
Neutrophils Relative %: 56 %
Platelet Count: 187 10*3/uL (ref 150–400)
RBC: 4.34 MIL/uL (ref 3.87–5.11)
RDW: 12.6 % (ref 11.5–15.5)
WBC: 4.2 10*3/uL (ref 4.0–10.5)
nRBC: 0 % (ref 0.0–0.2)

## 2018-03-23 LAB — PLATELET BY CITRATE

## 2018-03-23 LAB — SAVE SMEAR(SSMR), FOR PROVIDER SLIDE REVIEW

## 2018-03-23 NOTE — Progress Notes (Signed)
Hematology and Oncology Follow Up Visit  Early CharsRosalind L Biehl [id number] 03-24-1981 37 y.o. 03/23/2018   Principle Diagnosis:   Right lower lobe pulmonary embolism-resolved  Right thromboembolic disease in the right tibial femoral vein -partially resolved  Mild Protein S deficiency  Current Therapy:    Xarelto 20 mg p.o. daily-to complete 6 months in September 2019  Xarelto 10 mg po q day -- complete 1 yr in 01/2019      Interim History:  [name] is back for follow-up.  We saw her 3 months ago.  Since then, she has been doing pretty well.  She really has had no specific complaints.  When we last saw her in September, her total protein S percentage was only 57%.  Her free protein S was 48%.   She is now on maintenance Xarelto.  I think that this is a good idea for her.  She wants to finish this up in February.  I told her that studies have shown that 1 year of maintenance Xarelto would be appropriate.  This would mean she would had to be on it until September 2020.  This is a little bit tough on her.  She is going to have a busy Thanksgiving and Christmas season.  She is had no problems with bleeding.  Her monthly cycles are not as heavy.  She is taking supplemental iron.  This is helping her blood.  She is had no change in bowel or bladder habits.  Overall, her performance status is ECOG 1.   Medications:  Current Outpatient Medications:  .  Ascorbic Acid (VITAMIN C) 1000 MG tablet, Take 1,000 mg by mouth daily., Disp: , Rfl:  .  b complex vitamins tablet, Take 1 tablet by mouth daily., Disp: , Rfl:  .  Fe Bisgly-Vit C-Vit B12-FA (GENTLE IRON PO), Take 25 mg by mouth daily., Disp: , Rfl:  .  Omega-3 Fatty Acids (FISH OIL) 1200 MG CAPS, Take 1,200 mg by mouth., Disp: , Rfl:  .  Cholecalciferol (VITAMIN D3) 5000 units TABS, Take 5,000 Units by mouth daily., Disp: , Rfl:  .  folic acid (FOLVITE) 1 MG tablet, Take 1 tablet (1 mg total) by mouth daily., Disp: 30 tablet,  Rfl: 12 .  rivaroxaban (XARELTO) 10 MG TABS tablet, Take 1 tablet (10 mg total) by mouth daily., Disp: 30 tablet, Rfl: 11 .  Turmeric 450 MG CAPS, Take by mouth 2 (two) times daily., Disp: , Rfl:   Allergies:  Allergies  Allergen Reactions  . Sulfa Antibiotics Rash  . Sulfasalazine Rash    Past Medical History, Surgical history, Social history, and Family History were reviewed and updated.  Review of Systems: Review of Systems  Constitutional: Negative.   HENT:  Negative.   Eyes: Negative.   Respiratory: Negative.   Cardiovascular: Negative.   Gastrointestinal: Negative.   Endocrine: Negative.   Genitourinary: Negative.    Musculoskeletal: Negative.   Skin: Negative.   Neurological: Negative.   Hematological: Negative.   Psychiatric/Behavioral: Negative.     Physical Exam:  weight is 177 lb (80.3 kg). Her oral temperature is 98.2 F (36.8 C). Her blood pressure is 125/70 and her pulse is 74. Her respiration is 16 and oxygen saturation is 100%.   Wt Readings from Last 3 Encounters:  03/23/18 177 lb (80.3 kg)  01/24/18 176 lb (79.8 kg)  01/12/18 173 lb (78.5 kg)    Physical Exam  Constitutional: She is oriented to person, place, and time.  HENT:  Head:  Normocephalic and atraumatic.  Mouth/Throat: Oropharynx is clear and moist.  Eyes: Pupils are equal, round, and reactive to light. EOM are normal.  Neck: Normal range of motion.  Cardiovascular: Normal rate, regular rhythm and normal heart sounds.  Pulmonary/Chest: Effort normal and breath sounds normal.  Abdominal: Soft. Bowel sounds are normal.  Musculoskeletal: Normal range of motion. She exhibits no edema, tenderness or deformity.  Lymphadenopathy:    She has no cervical adenopathy.  Neurological: She is alert and oriented to person, place, and time.  Skin: Skin is warm and dry. No rash noted. No erythema.  Psychiatric: She has a normal mood and affect. Her behavior is normal. Judgment and thought content  normal.  Vitals reviewed.    Lab Results  Component Value Date   WBC 4.2 03/23/2018   HGB 13.2 03/23/2018   HCT 41.3 03/23/2018   MCV 95.2 03/23/2018   PLT 187 03/23/2018     Chemistry      Component Value Date/Time   NA 136 01/12/2018 1155   NA 138 06/17/2017 1338   K 4.1 01/12/2018 1155   CL 102 01/12/2018 1155   CO2 25 01/12/2018 1155   BUN 14 01/12/2018 1155   BUN 20 06/17/2017 1338   CREATININE 0.99 01/12/2018 1155   CREATININE 0.71 06/14/2016 1434      Component Value Date/Time   CALCIUM 9.4 01/12/2018 1155   ALKPHOS 48 01/12/2018 1155   AST 18 01/12/2018 1155   ALT 9 01/12/2018 1155   BILITOT 0.7 01/12/2018 1155      Impression and Plan: Ms. Lafountain is a 37 year old Afro-American female.  She has a mildly depressed Protein S level.  I told her that we would compromise and that we would go with maintenance Xarelto for 6 months.  I will keep her on maintenance Xarelto until we see her in May.  If all looks good in May, then we will get her off the Xarelto.  I will then see if she would go onto baby aspirin.  She would be agreeable to that.  Josph Macho, MD 11/21/20193:34 PM

## 2018-03-24 ENCOUNTER — Telehealth: Payer: Self-pay | Admitting: Hematology & Oncology

## 2018-03-24 ENCOUNTER — Encounter: Payer: Self-pay | Admitting: *Deleted

## 2018-03-24 DIAGNOSIS — M542 Cervicalgia: Secondary | ICD-10-CM | POA: Diagnosis not present

## 2018-03-24 DIAGNOSIS — M5124 Other intervertebral disc displacement, thoracic region: Secondary | ICD-10-CM | POA: Diagnosis not present

## 2018-03-24 DIAGNOSIS — M791 Myalgia, unspecified site: Secondary | ICD-10-CM | POA: Diagnosis not present

## 2018-03-24 LAB — CMP (CANCER CENTER ONLY)
ALT: 18 U/L (ref 0–44)
AST: 26 U/L (ref 15–41)
Albumin: 4 g/dL (ref 3.5–5.0)
Alkaline Phosphatase: 44 U/L (ref 38–126)
Anion gap: 9 (ref 5–15)
BUN: 14 mg/dL (ref 6–20)
CHLORIDE: 105 mmol/L (ref 98–111)
CO2: 27 mmol/L (ref 22–32)
CREATININE: 1.05 mg/dL — AB (ref 0.44–1.00)
Calcium: 9.5 mg/dL (ref 8.9–10.3)
GFR, Est AFR Am: >60 mL/min (ref >60)
Glucose, Bld: 91 mg/dL (ref 70–99)
Potassium: 4.1 mmol/L (ref 3.5–5.1)
Sodium: 141 mmol/L (ref 135–145)
Total Bilirubin: 0.7 mg/dL (ref 0.3–1.2)
Total Protein: 7.5 g/dL (ref 6.5–8.1)

## 2018-03-24 LAB — FERRITIN: FERRITIN: 29 ng/mL (ref 11–307)

## 2018-03-24 LAB — IRON AND TIBC
IRON: 102 ug/dL (ref 41–142)
Saturation Ratios: 32 % (ref 21–57)
TIBC: 321 ug/dL (ref 236–444)
UIBC: 219 ug/dL (ref 120–384)

## 2018-03-24 NOTE — Telephone Encounter (Signed)
Appointments scheduled letter/calendar mailed per 11/21 los °

## 2018-03-27 DIAGNOSIS — M542 Cervicalgia: Secondary | ICD-10-CM | POA: Diagnosis not present

## 2018-03-27 DIAGNOSIS — M791 Myalgia, unspecified site: Secondary | ICD-10-CM | POA: Diagnosis not present

## 2018-03-27 DIAGNOSIS — M5124 Other intervertebral disc displacement, thoracic region: Secondary | ICD-10-CM | POA: Diagnosis not present

## 2018-03-29 DIAGNOSIS — M542 Cervicalgia: Secondary | ICD-10-CM | POA: Diagnosis not present

## 2018-03-29 DIAGNOSIS — M50323 Other cervical disc degeneration at C6-C7 level: Secondary | ICD-10-CM | POA: Diagnosis not present

## 2018-03-29 DIAGNOSIS — M50223 Other cervical disc displacement at C6-C7 level: Secondary | ICD-10-CM | POA: Diagnosis not present

## 2018-03-29 DIAGNOSIS — M5124 Other intervertebral disc displacement, thoracic region: Secondary | ICD-10-CM | POA: Diagnosis not present

## 2018-04-04 DIAGNOSIS — M542 Cervicalgia: Secondary | ICD-10-CM | POA: Diagnosis not present

## 2018-04-04 DIAGNOSIS — M5124 Other intervertebral disc displacement, thoracic region: Secondary | ICD-10-CM | POA: Diagnosis not present

## 2018-04-04 DIAGNOSIS — M791 Myalgia, unspecified site: Secondary | ICD-10-CM | POA: Diagnosis not present

## 2018-04-06 DIAGNOSIS — M50223 Other cervical disc displacement at C6-C7 level: Secondary | ICD-10-CM | POA: Diagnosis not present

## 2018-04-06 DIAGNOSIS — M5124 Other intervertebral disc displacement, thoracic region: Secondary | ICD-10-CM | POA: Diagnosis not present

## 2018-04-06 DIAGNOSIS — M50323 Other cervical disc degeneration at C6-C7 level: Secondary | ICD-10-CM | POA: Diagnosis not present

## 2018-04-11 DIAGNOSIS — M50323 Other cervical disc degeneration at C6-C7 level: Secondary | ICD-10-CM | POA: Diagnosis not present

## 2018-04-11 DIAGNOSIS — M50223 Other cervical disc displacement at C6-C7 level: Secondary | ICD-10-CM | POA: Diagnosis not present

## 2018-04-11 DIAGNOSIS — M5124 Other intervertebral disc displacement, thoracic region: Secondary | ICD-10-CM | POA: Diagnosis not present

## 2018-04-13 DIAGNOSIS — M5124 Other intervertebral disc displacement, thoracic region: Secondary | ICD-10-CM | POA: Diagnosis not present

## 2018-04-13 DIAGNOSIS — M50223 Other cervical disc displacement at C6-C7 level: Secondary | ICD-10-CM | POA: Diagnosis not present

## 2018-04-13 DIAGNOSIS — M50323 Other cervical disc degeneration at C6-C7 level: Secondary | ICD-10-CM | POA: Diagnosis not present

## 2018-04-20 DIAGNOSIS — M50323 Other cervical disc degeneration at C6-C7 level: Secondary | ICD-10-CM | POA: Diagnosis not present

## 2018-04-20 DIAGNOSIS — M5124 Other intervertebral disc displacement, thoracic region: Secondary | ICD-10-CM | POA: Diagnosis not present

## 2018-04-20 DIAGNOSIS — M50223 Other cervical disc displacement at C6-C7 level: Secondary | ICD-10-CM | POA: Diagnosis not present

## 2018-04-24 DIAGNOSIS — M5124 Other intervertebral disc displacement, thoracic region: Secondary | ICD-10-CM | POA: Diagnosis not present

## 2018-04-24 DIAGNOSIS — M50323 Other cervical disc degeneration at C6-C7 level: Secondary | ICD-10-CM | POA: Diagnosis not present

## 2018-04-24 DIAGNOSIS — M50223 Other cervical disc displacement at C6-C7 level: Secondary | ICD-10-CM | POA: Diagnosis not present

## 2018-07-27 DIAGNOSIS — I824Z1 Acute embolism and thrombosis of unspecified deep veins of right distal lower extremity: Secondary | ICD-10-CM | POA: Diagnosis not present

## 2018-07-27 DIAGNOSIS — I82411 Acute embolism and thrombosis of right femoral vein: Secondary | ICD-10-CM | POA: Diagnosis not present

## 2018-07-27 DIAGNOSIS — I83893 Varicose veins of bilateral lower extremities with other complications: Secondary | ICD-10-CM | POA: Diagnosis not present

## 2018-09-07 ENCOUNTER — Encounter: Payer: Self-pay | Admitting: Hematology & Oncology

## 2018-09-12 ENCOUNTER — Telehealth (INDEPENDENT_AMBULATORY_CARE_PROVIDER_SITE_OTHER): Payer: 59 | Admitting: Family Medicine

## 2018-09-12 ENCOUNTER — Other Ambulatory Visit: Payer: Self-pay

## 2018-09-12 DIAGNOSIS — D6859 Other primary thrombophilia: Secondary | ICD-10-CM

## 2018-09-12 DIAGNOSIS — G479 Sleep disorder, unspecified: Secondary | ICD-10-CM | POA: Diagnosis not present

## 2018-09-12 DIAGNOSIS — G44229 Chronic tension-type headache, not intractable: Secondary | ICD-10-CM | POA: Diagnosis not present

## 2018-09-12 DIAGNOSIS — R200 Anesthesia of skin: Secondary | ICD-10-CM | POA: Diagnosis not present

## 2018-09-12 DIAGNOSIS — Z7901 Long term (current) use of anticoagulants: Secondary | ICD-10-CM

## 2018-09-12 DIAGNOSIS — R202 Paresthesia of skin: Secondary | ICD-10-CM
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
## 2018-09-19 ENCOUNTER — Other Ambulatory Visit: Payer: Self-pay

## 2018-09-19 ENCOUNTER — Encounter: Payer: Self-pay | Admitting: Family Medicine

## 2018-09-19 ENCOUNTER — Ambulatory Visit: Payer: 59 | Admitting: Family Medicine

## 2018-09-19 VITALS — BP 106/70 | HR 63 | Temp 98.0°F | Resp 17 | Ht 66.0 in | Wt 188.6 lb

## 2018-09-19 DIAGNOSIS — R202 Paresthesia of skin: Secondary | ICD-10-CM

## 2018-09-19 DIAGNOSIS — Z131 Encounter for screening for diabetes mellitus: Secondary | ICD-10-CM | POA: Diagnosis not present

## 2018-09-19 DIAGNOSIS — Z13220 Encounter for screening for lipoid disorders: Secondary | ICD-10-CM

## 2018-09-19 DIAGNOSIS — Z136 Encounter for screening for cardiovascular disorders: Secondary | ICD-10-CM | POA: Diagnosis not present

## 2018-09-19 DIAGNOSIS — Z0000 Encounter for general adult medical examination without abnormal findings: Secondary | ICD-10-CM

## 2018-09-19 DIAGNOSIS — Z0001 Encounter for general adult medical examination with abnormal findings: Secondary | ICD-10-CM

## 2018-09-19 DIAGNOSIS — R200 Anesthesia of skin: Secondary | ICD-10-CM

## 2018-09-19 NOTE — Patient Instructions (Signed)
° ° ° °  If you have lab work done today you will be contacted with your lab results within the next 2 weeks.  If you have not heard from us then please contact us. The fastest way to get your results is to register for My Chart. ° ° °IF you received an x-ray today, you will receive an invoice from [facility]. Please contact [facility] at [phone] with questions or concerns regarding your invoice.  ° °IF you received labwork today, you will receive an invoice from [facility]. Please contact [facility] at [phone] with questions or concerns regarding your invoice.  ° °Our billing staff will not be able to assist you with questions regarding bills from these companies. ° °You will be contacted with the lab results as soon as they are available. The fastest way to get your results is to activate your My Chart account. Instructions are located on the last page of this paperwork. If you have not heard from us regarding the results in 2 weeks, please contact this office. °  ° ° ° °
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
## 2018-09-20 LAB — VITAMIN B12: Vitamin B-12: 902 pg/mL (ref 232–1245)

## 2018-09-20 LAB — HEMOGLOBIN A1C
Est. average glucose Bld gHb Est-mCnc: 97 mg/dL
Hgb A1c MFr Bld: 5 % (ref 4.8–5.6)

## 2018-09-20 LAB — LIPID PANEL
Chol/HDL Ratio: 3 ratio (ref 0.0–4.4)
Cholesterol, Total: 300 mg/dL — ABNORMAL HIGH (ref 100–199)
HDL: 101 mg/dL (ref >39)
LDL Calculated: 182 mg/dL — ABNORMAL HIGH (ref 0–99)
Triglycerides: 86 mg/dL (ref 0–149)
VLDL Cholesterol Cal: 17 mg/dL (ref 5–40)

## 2018-09-20 LAB — TSH: TSH: 0.997 u[IU]/mL (ref 0.450–4.500)

## 2018-09-21 ENCOUNTER — Ambulatory Visit: Payer: 59 | Admitting: Family

## 2018-09-21 ENCOUNTER — Other Ambulatory Visit: Payer: 59

## 2018-10-02 ENCOUNTER — Inpatient Hospital Stay: Payer: 59 | Attending: Hematology & Oncology

## 2018-10-02 ENCOUNTER — Inpatient Hospital Stay (HOSPITAL_BASED_OUTPATIENT_CLINIC_OR_DEPARTMENT_OTHER): Payer: 59 | Admitting: Hematology & Oncology

## 2018-10-02 ENCOUNTER — Other Ambulatory Visit: Payer: Self-pay

## 2018-10-02 ENCOUNTER — Encounter: Payer: Self-pay | Admitting: Hematology & Oncology

## 2018-10-02 ENCOUNTER — Telehealth: Payer: Self-pay | Admitting: Hematology & Oncology

## 2018-10-02 VITALS — BP 116/65 | HR 63 | Temp 98.1°F | Resp 18 | Wt 189.0 lb

## 2018-10-02 DIAGNOSIS — D6859 Other primary thrombophilia: Secondary | ICD-10-CM | POA: Diagnosis not present

## 2018-10-02 DIAGNOSIS — M542 Cervicalgia: Secondary | ICD-10-CM | POA: Diagnosis not present

## 2018-10-02 DIAGNOSIS — Z7901 Long term (current) use of anticoagulants: Secondary | ICD-10-CM | POA: Insufficient documentation

## 2018-10-02 DIAGNOSIS — I82431 Acute embolism and thrombosis of right popliteal vein: Secondary | ICD-10-CM | POA: Diagnosis not present

## 2018-10-02 DIAGNOSIS — Z79899 Other long term (current) drug therapy: Secondary | ICD-10-CM | POA: Diagnosis not present

## 2018-10-02 DIAGNOSIS — I83892 Varicose veins of left lower extremities with other complications: Secondary | ICD-10-CM

## 2018-10-02 LAB — SAVE SMEAR(SSMR), FOR PROVIDER SLIDE REVIEW

## 2018-10-02 LAB — CMP (CANCER CENTER ONLY)
ALT: 10 U/L (ref 0–44)
AST: 14 U/L — ABNORMAL LOW (ref 15–41)
Albumin: 4.2 g/dL (ref 3.5–5.0)
Alkaline Phosphatase: 38 U/L (ref 38–126)
Anion gap: 7 (ref 5–15)
BUN: 17 mg/dL (ref 6–20)
CO2: 26 mmol/L (ref 22–32)
Calcium: 8.8 mg/dL — ABNORMAL LOW (ref 8.9–10.3)
Chloride: 105 mmol/L (ref 98–111)
Creatinine: 0.94 mg/dL (ref 0.44–1.00)
GFR, Est AFR Am: >60 mL/min (ref >60)
GFR, Estimated: >60 mL/min (ref >60)
Glucose, Bld: 93 mg/dL (ref 70–99)
Potassium: 4.4 mmol/L (ref 3.5–5.1)
Sodium: 138 mmol/L (ref 135–145)
Total Bilirubin: 0.7 mg/dL (ref 0.3–1.2)
Total Protein: 7.1 g/dL (ref 6.5–8.1)

## 2018-10-02 LAB — CBC WITH DIFFERENTIAL (CANCER CENTER ONLY)
Abs Immature Granulocytes: 0.01 10*3/uL (ref 0.00–0.07)
Basophils Absolute: 0 10*3/uL (ref 0.0–0.1)
Basophils Relative: 1 %
Eosinophils Absolute: 0.1 10*3/uL (ref 0.0–0.5)
Eosinophils Relative: 3 %
HCT: 40 % (ref 36.0–46.0)
Hemoglobin: 13.5 g/dL (ref 12.0–15.0)
Immature Granulocytes: 0 %
Lymphocytes Relative: 29 %
Lymphs Abs: 1.4 10*3/uL (ref 0.7–4.0)
MCH: 31.5 pg (ref 26.0–34.0)
MCHC: 33.8 g/dL (ref 30.0–36.0)
MCV: 93.2 fL (ref 80.0–100.0)
Monocytes Absolute: 0.4 10*3/uL (ref 0.1–1.0)
Monocytes Relative: 9 %
Neutro Abs: 2.7 10*3/uL (ref 1.7–7.7)
Neutrophils Relative %: 58 %
Platelet Count: 146 10*3/uL — ABNORMAL LOW (ref 150–400)
RBC: 4.29 MIL/uL (ref 3.87–5.11)
RDW: 12.2 % (ref 11.5–15.5)
WBC Count: 4.7 10*3/uL (ref 4.0–10.5)
nRBC: 0 % (ref 0.0–0.2)

## 2018-10-02 LAB — PLATELET BY CITRATE

## 2018-10-02 NOTE — Telephone Encounter (Signed)
Spoke with patient to confirm 9/1 appts at 0830 pm per 6/1 LOS
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
## 2019-01-02 ENCOUNTER — Inpatient Hospital Stay: Payer: 59

## 2019-01-02 ENCOUNTER — Inpatient Hospital Stay: Payer: 59 | Attending: Hematology & Oncology | Admitting: Hematology & Oncology

## 2019-01-02 ENCOUNTER — Other Ambulatory Visit: Payer: Self-pay

## 2019-01-02 ENCOUNTER — Encounter: Payer: Self-pay | Admitting: Hematology & Oncology

## 2019-01-02 VITALS — BP 113/75 | HR 61 | Temp 97.8°F | Resp 18 | Wt 189.6 lb

## 2019-01-02 DIAGNOSIS — I2601 Septic pulmonary embolism with acute cor pulmonale: Secondary | ICD-10-CM | POA: Diagnosis not present

## 2019-01-02 DIAGNOSIS — Z7982 Long term (current) use of aspirin: Secondary | ICD-10-CM | POA: Insufficient documentation

## 2019-01-02 DIAGNOSIS — D6859 Other primary thrombophilia: Secondary | ICD-10-CM | POA: Insufficient documentation

## 2019-01-02 DIAGNOSIS — Z7901 Long term (current) use of anticoagulants: Secondary | ICD-10-CM | POA: Insufficient documentation

## 2019-01-02 DIAGNOSIS — I82431 Acute embolism and thrombosis of right popliteal vein: Secondary | ICD-10-CM | POA: Diagnosis present

## 2019-01-02 DIAGNOSIS — Z79899 Other long term (current) drug therapy: Secondary | ICD-10-CM | POA: Insufficient documentation

## 2019-01-02 DIAGNOSIS — Z86718 Personal history of other venous thrombosis and embolism: Secondary | ICD-10-CM | POA: Insufficient documentation

## 2019-01-02 LAB — CMP (CANCER CENTER ONLY)
ALT: 10 U/L (ref 0–44)
AST: 14 U/L — ABNORMAL LOW (ref 15–41)
Albumin: 4 g/dL (ref 3.5–5.0)
Alkaline Phosphatase: 42 U/L (ref 38–126)
Anion gap: 5 (ref 5–15)
BUN: 13 mg/dL (ref 6–20)
CO2: 28 mmol/L (ref 22–32)
Calcium: 9.1 mg/dL (ref 8.9–10.3)
Chloride: 103 mmol/L (ref 98–111)
Creatinine: 0.87 mg/dL (ref 0.44–1.00)
GFR, Est AFR Am: >60 mL/min (ref >60)
GFR, Estimated: >60 mL/min (ref >60)
Glucose, Bld: 87 mg/dL (ref 70–99)
Potassium: 4.3 mmol/L (ref 3.5–5.1)
Sodium: 136 mmol/L (ref 135–145)
Total Bilirubin: 0.5 mg/dL (ref 0.3–1.2)
Total Protein: 6.8 g/dL (ref 6.5–8.1)

## 2019-01-02 LAB — CBC WITH DIFFERENTIAL (CANCER CENTER ONLY)
Abs Immature Granulocytes: 0 10*3/uL (ref 0.00–0.07)
Basophils Absolute: 0 10*3/uL (ref 0.0–0.1)
Basophils Relative: 1 %
Eosinophils Absolute: 0.2 10*3/uL (ref 0.0–0.5)
Eosinophils Relative: 4 %
HCT: 40.6 % (ref 36.0–46.0)
Hemoglobin: 13.3 g/dL (ref 12.0–15.0)
Immature Granulocytes: 0 %
Lymphocytes Relative: 33 %
Lymphs Abs: 1.4 10*3/uL (ref 0.7–4.0)
MCH: 30.2 pg (ref 26.0–34.0)
MCHC: 32.8 g/dL (ref 30.0–36.0)
MCV: 92.1 fL (ref 80.0–100.0)
Monocytes Absolute: 0.4 10*3/uL (ref 0.1–1.0)
Monocytes Relative: 9 %
Neutro Abs: 2.2 10*3/uL (ref 1.7–7.7)
Neutrophils Relative %: 53 %
Platelet Count: 164 10*3/uL (ref 150–400)
RBC: 4.41 MIL/uL (ref 3.87–5.11)
RDW: 12.6 % (ref 11.5–15.5)
WBC Count: 4.2 10*3/uL (ref 4.0–10.5)
nRBC: 0 % (ref 0.0–0.2)
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
## 2019-01-04 LAB — PROTEIN S, ANTIGEN, FREE: Protein S Ag, Free: 42 % — ABNORMAL LOW (ref 57–157)

## 2019-01-04 LAB — PROTEIN S ACTIVITY: Protein S Activity: 59 % — ABNORMAL LOW (ref 63–140)

## 2019-01-04 LAB — PROTEIN S, TOTAL: Protein S Ag, Total: 70 % (ref 60–150)

## 2019-02-22 ENCOUNTER — Encounter: Payer: Self-pay | Admitting: Obstetrics & Gynecology

## 2019-02-22 LAB — HM MAMMOGRAPHY

## 2019-03-12 IMAGING — US US ABDOMEN COMPLETE
1 series · 14 of 25 positions shown · non-contrast
Comparison: None.

CLINICAL DATA: Chest and back pain.  Weight loss.  He

EXAM:
ABDOMEN ULTRASOUND COMPLETE

[Series 1: us abdomen complete · 0.22mm/px · 14 of 194 slices shown]
[im 1/194]
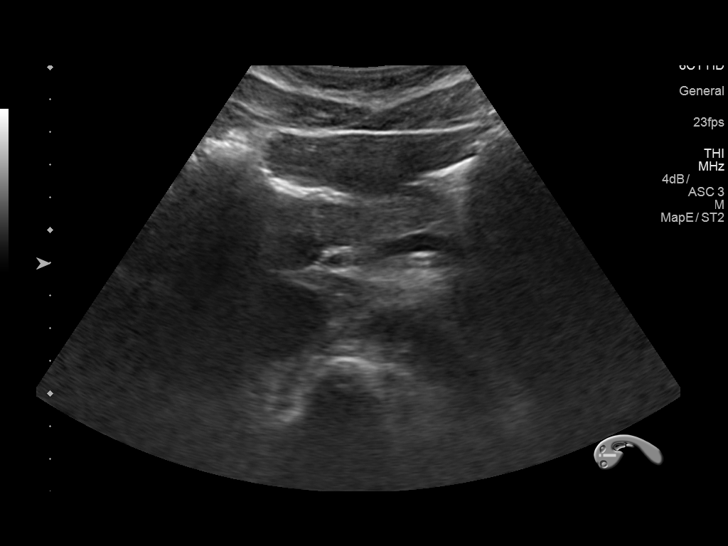
[im 17/194]
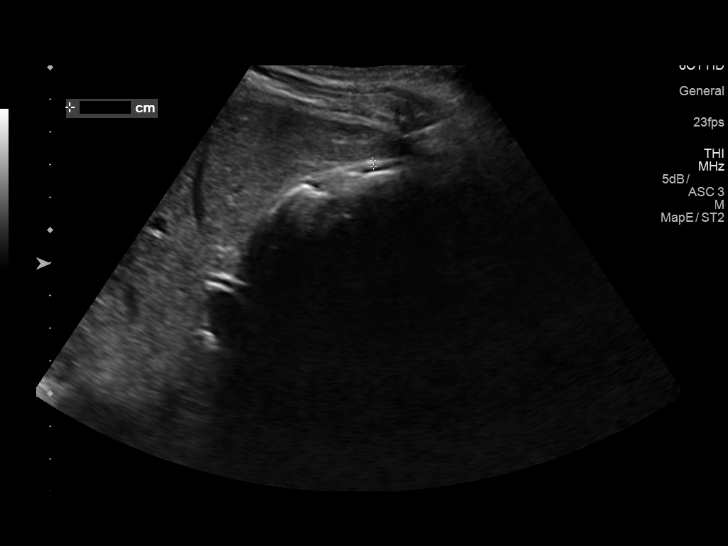
[im 33/194]
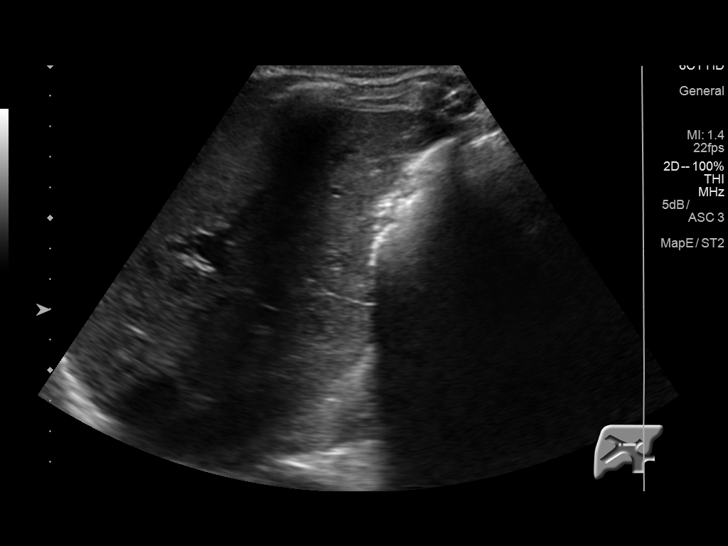
[im 49/194]
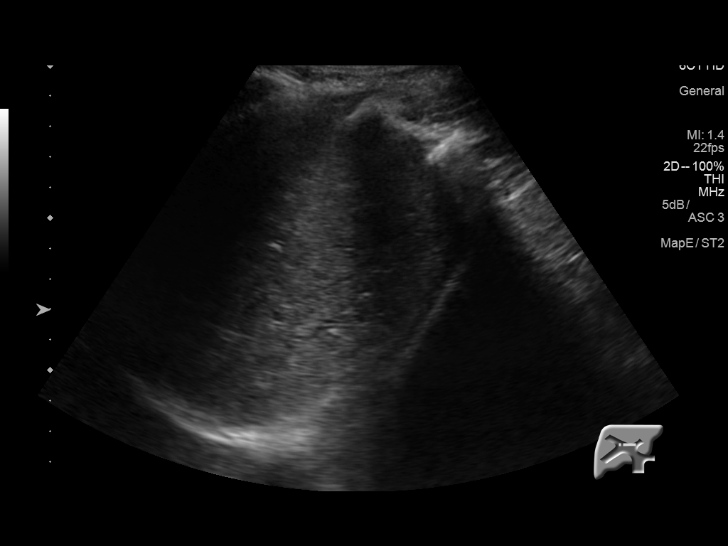
[im 65/194]
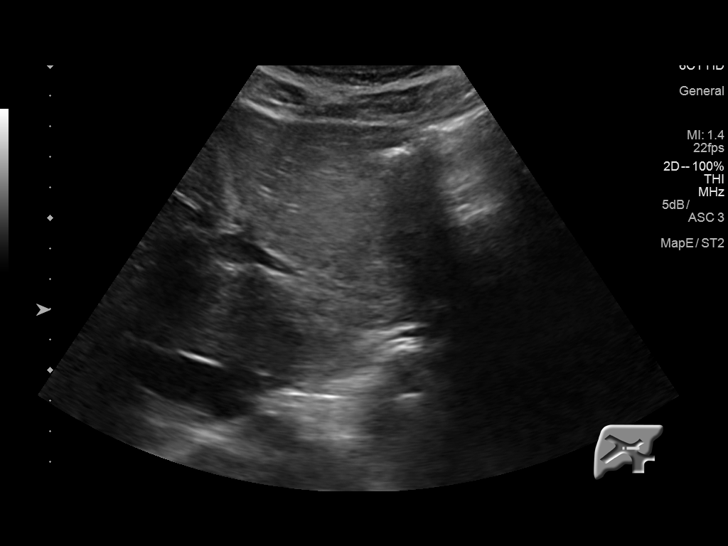
[im 73/194]
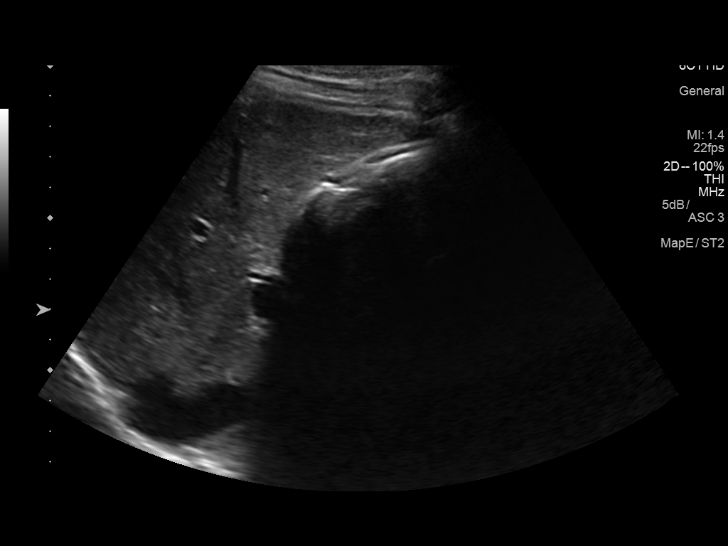
[im 89/194]
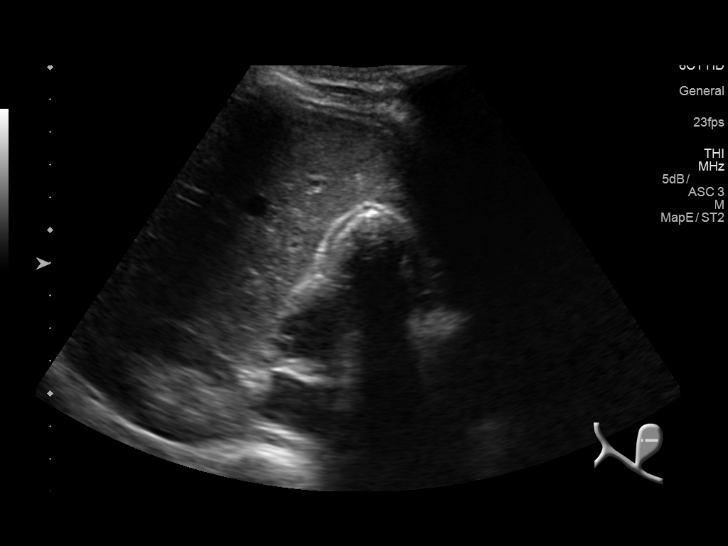
[im 105/194]
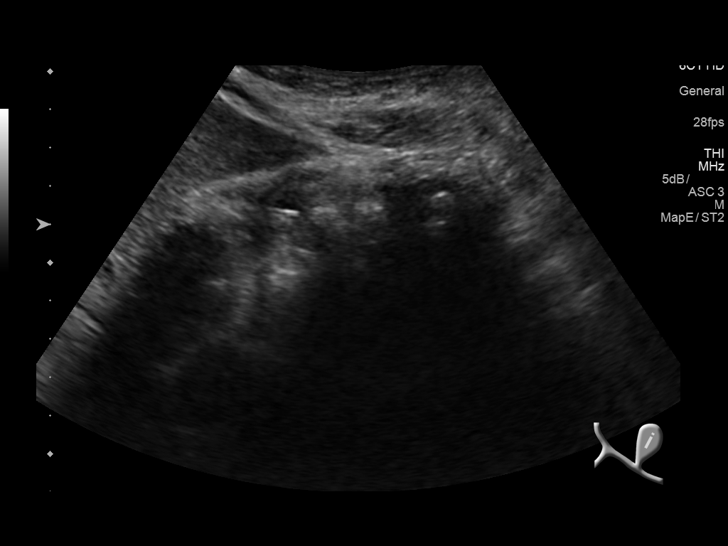
[im 121/194]
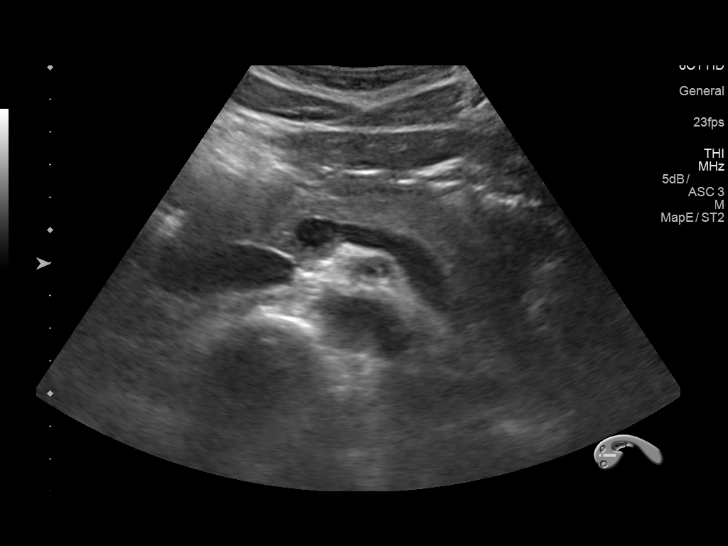
[im 129/194]
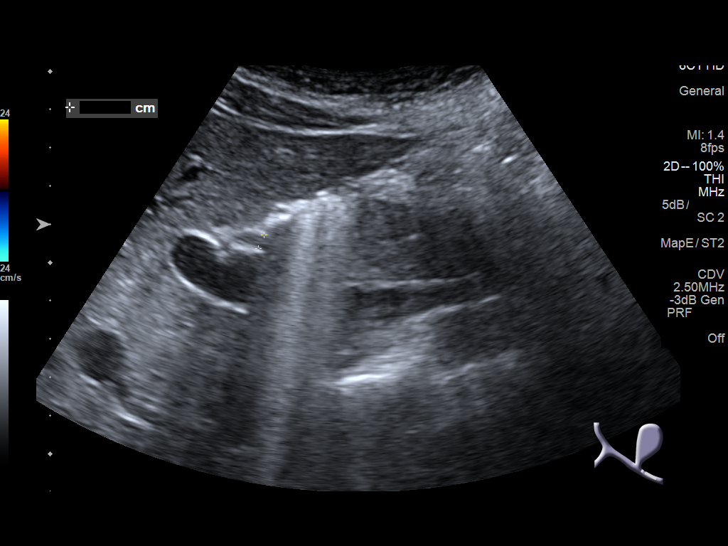
[im 145/194]
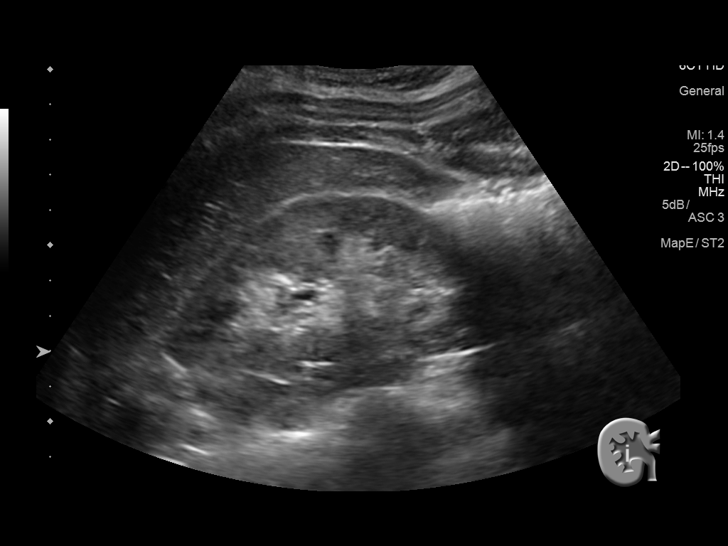
[im 161/194]
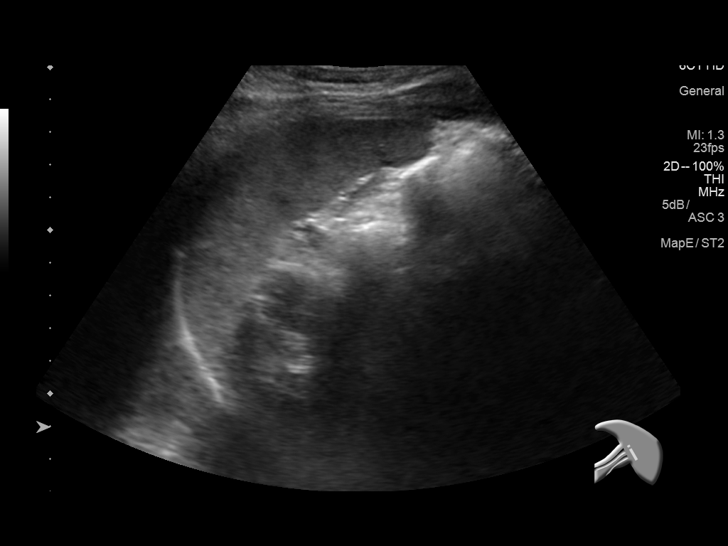
[im 177/194]
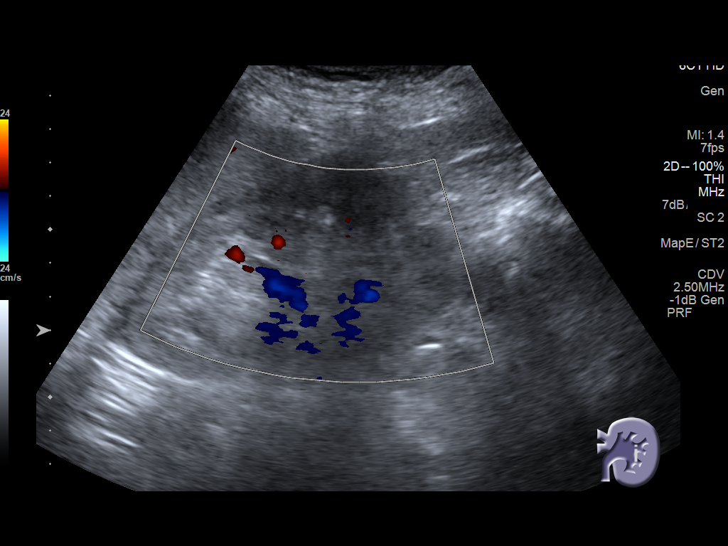
[im 194/194]
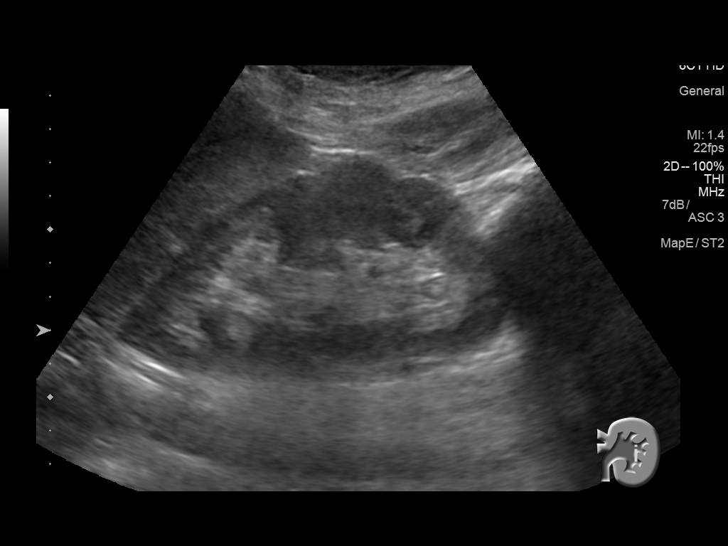

[14 of 25 positions shown; findings below may reference images not displayed]

FINDINGS: Gallbladder: There is a 2.4 cm stone in the gallbladder. No
thickening of the gallbladder wall. No pericholecystic fluid.

Common bile duct: Diameter: 3.8 mm

Liver: No focal lesion identified. Within normal limits in
parenchymal echogenicity. Portal vein is patent on color Doppler
imaging with normal direction of blood flow towards the liver.

IVC: No abnormality visualized.

Pancreas: Visualized portion unremarkable.

Spleen: Size and appearance within normal limits.

Right Kidney: Length: 10.1 cm. Echogenicity within normal limits. No
mass or hydronephrosis visualized.

Left Kidney: Length: 11.7 cm. Echogenicity within normal limits. No
mass or hydronephrosis visualized.

Abdominal aorta: No aneurysm visualized.

Other findings: None.
IMPRESSION: Large gallstone.  Otherwise, benign appearing abdomen.

## 2019-03-16 ENCOUNTER — Telehealth: Payer: Self-pay | Admitting: General Practice

## 2019-03-16 NOTE — Telephone Encounter (Signed)
Spoke to pt about her referral.  Pt doers not want to go to neurology.  She wants a referral to Dr Charisse March.  He is a Development worker, international aid in [place], with [facility]  Please advise

## 2019-03-21 IMAGING — CT CT ANGIO CHEST
2 of 8 series · 18 of 36 positions shown · IV contrast (iopamidol)
Comparison: CT angiogram chest June 23, 2017

CLINICAL DATA: Prior pulmonary embolus.  Fatigue.

EXAM:
CT ANGIOGRAPHY CHEST WITH CONTRAST
TECHNIQUE: Multidetector CT imaging of the chest was performed using the
standard protocol during bolus administration of intravenous
contrast. Multiplanar CT image reconstructions and MIPs were
obtained to evaluate the vascular anatomy.
CONTRAST:  100mL [id number] IOPAMIDOL ([id number]) INJECTION 76%

[Series 6: pe coronal mpr · coronal · 0.49mm/px · 1 of 113 slices shown]
[im 57/113  mediastinal]
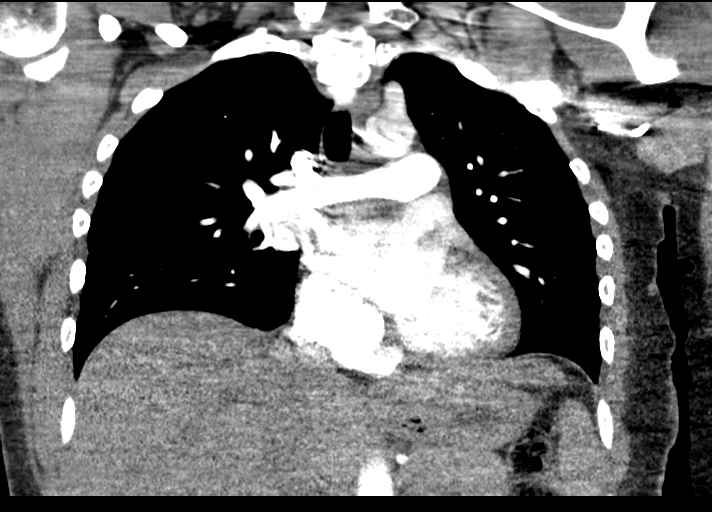

[Series 10: pe thins · axial · 0.66mm/px · z∈[-256,-40]mm · 17 of 242 slices shown]
[im 13/242  lung]
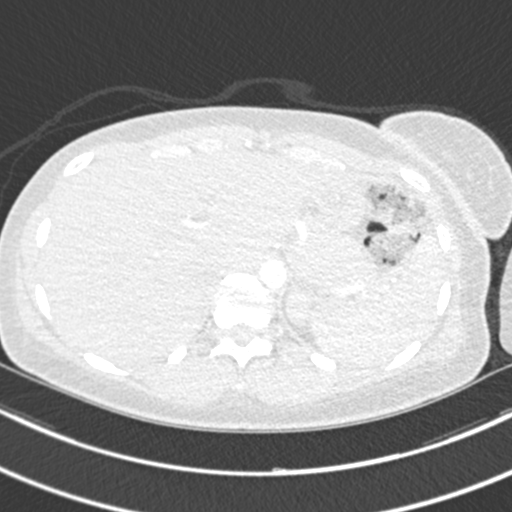
[im 26/242  mediastinal]
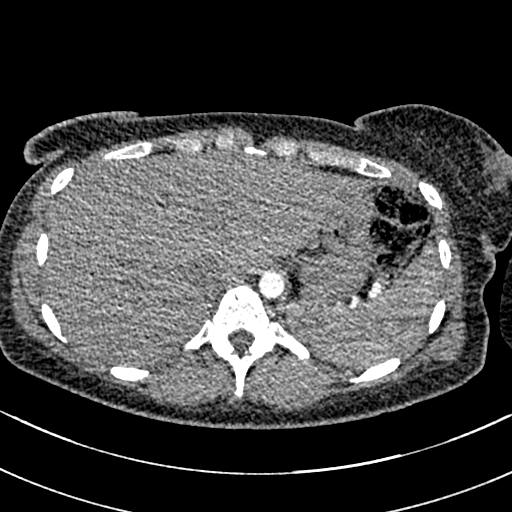
[im 39/242  lung]
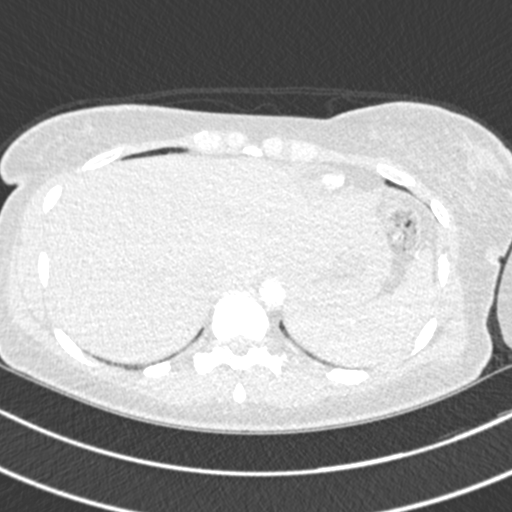
[im 51/242  mediastinal]
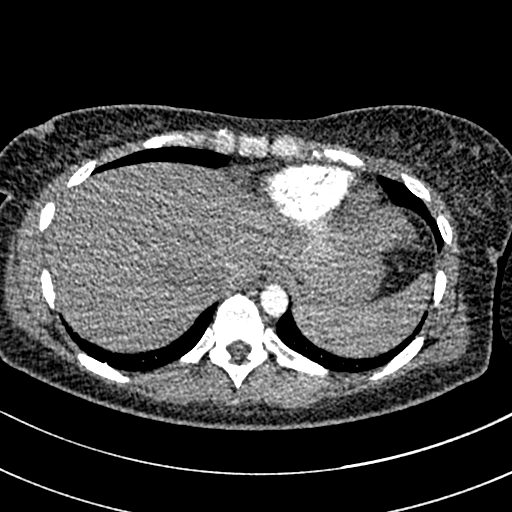
[im 64/242  lung]
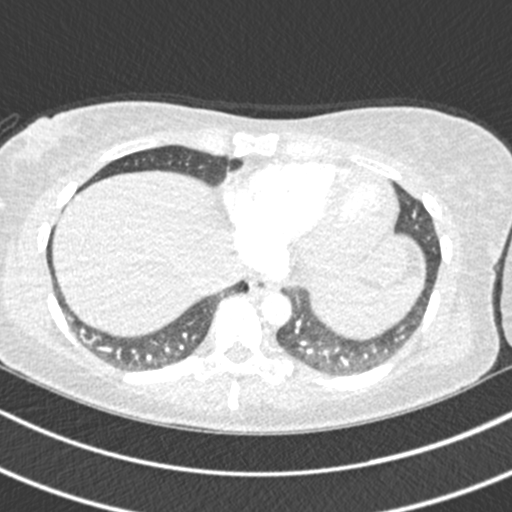
[im 77/242  mediastinal]
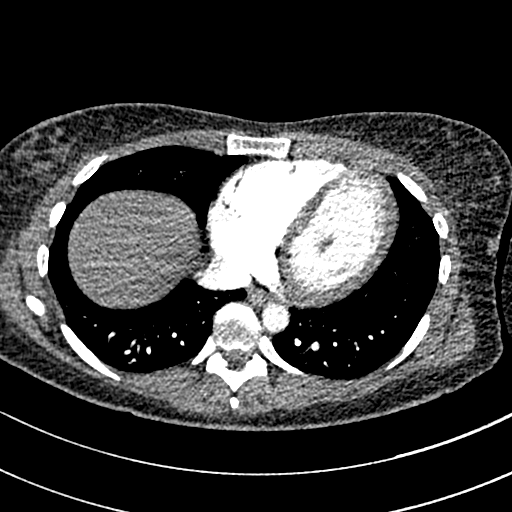
[im 89/242  lung]
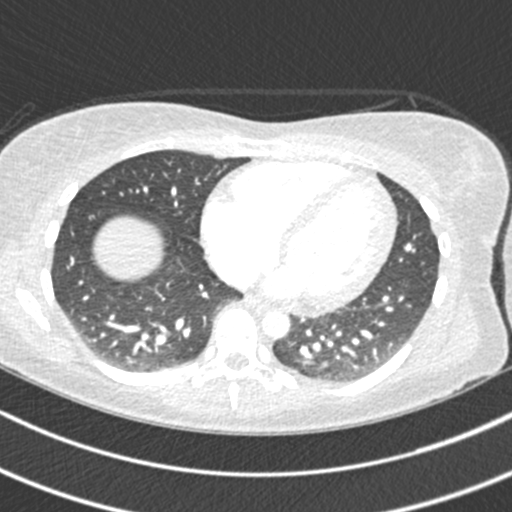
[im 102/242  mediastinal]
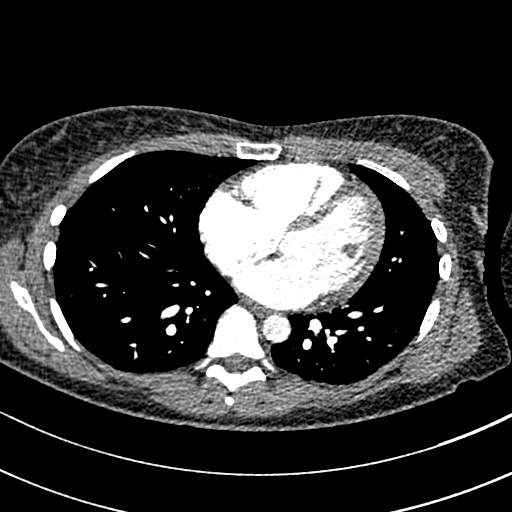
[im 127/242  lung]
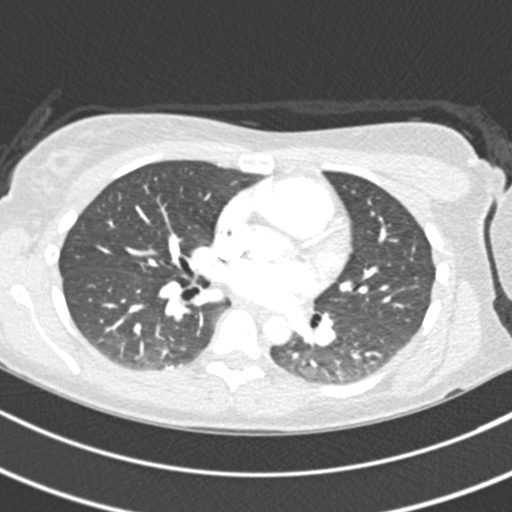
[im 140/242  mediastinal]
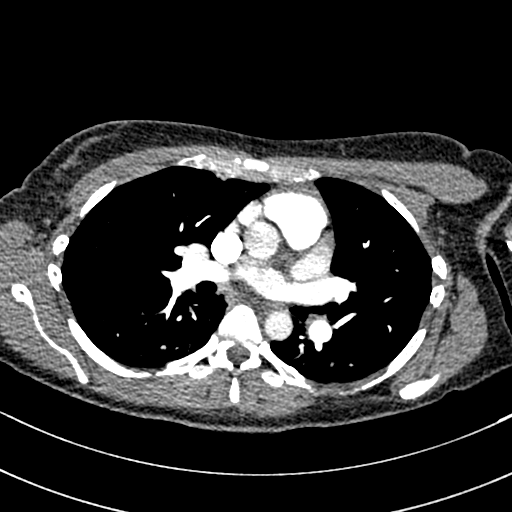
[im 153/242  lung]
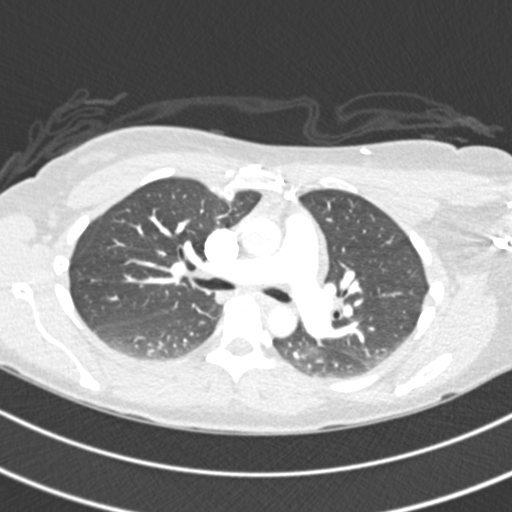
[im 165/242  mediastinal]
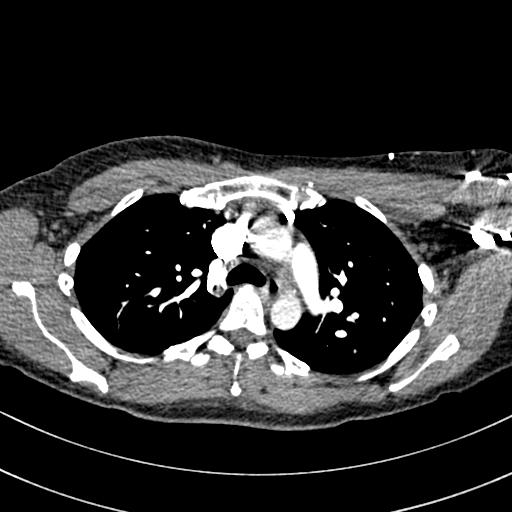
[im 178/242  lung]
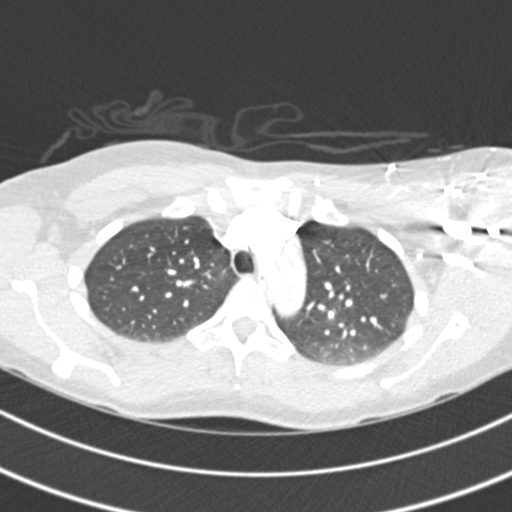
[im 191/242  mediastinal]
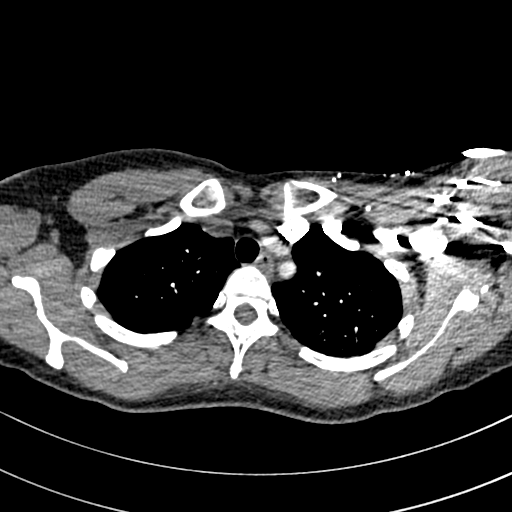
[im 203/242  lung]
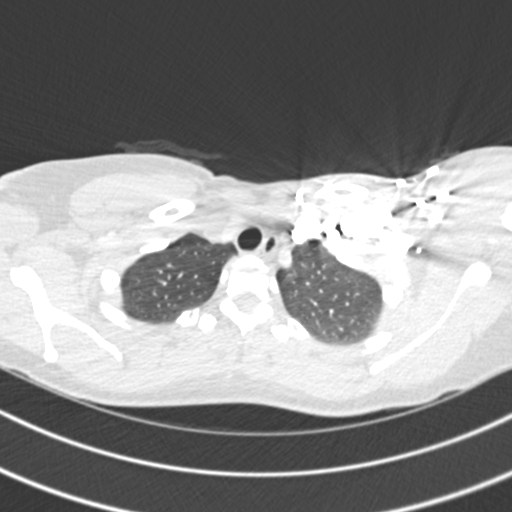
[im 216/242  mediastinal]
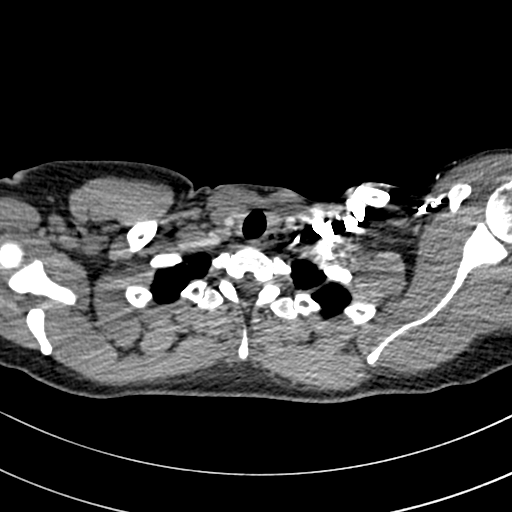
[im 229/242  lung]
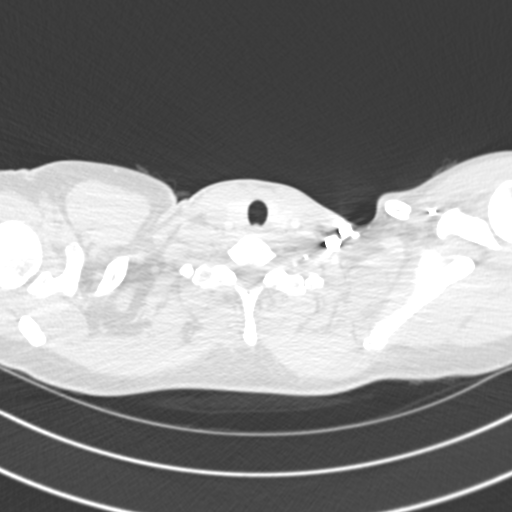

[18 of 36 positions shown; findings below may reference images not displayed]

FINDINGS: Cardiovascular: Currently there is no evidence of acute pulmonary
embolus. In the proximal right lower lobe pulmonary artery, there is
some slight wall thickening in the area of previous pulmonary
embolus, likely a small residual area of chronic pulmonary embolus
change. No similar changes elsewhere.

No thoracic aortic aneurysm or dissection. Visualized great vessels
appear normal. There is no pericardial effusion or pericardial
thickening evident.

Mediastinum/Nodes: Visualized thyroid appears normal. There is no
appreciable thoracic adenopathy. No esophageal lesions are evident.

Lungs/Pleura: There is no edema or consolidation. There is slight
dependent atelectasis in the left base. No pleural effusion or
pleural thickening evident.

Upper Abdomen: Visualized upper abdominal structures appear
unremarkable.

Musculoskeletal: There are no blastic or lytic bone lesions. There
are no chest wall lesions evident.

Review of the MIP images confirms the above findings.
IMPRESSION: 1. No acute pulmonary embolus evident. In the area of previous
pulmonary embolus in the proximal right lower lobe pulmonary artery
region, there is mild intimal thickening, consistent with mild
chronic embolus change. No similar changes elsewhere.

2.  No thoracic aortic aneurysm or dissection.

3. Lungs clear except for slight dependent atelectasis posterior
left base.

4.  No evident thoracic adenopathy.

## 2019-03-22 NOTE — Telephone Encounter (Signed)
FYI
